# Patient Record
Sex: Male | Born: 1955 | Race: Black or African American | Hispanic: No
Health system: Southern US, Community
[De-identification: ages and names within clinical notes are randomized; demographics above are authoritative.]

---

## 1997-10-24 ENCOUNTER — Ambulatory Visit (HOSPITAL_COMMUNITY): Admission: RE | Admit: 1997-10-24 | Discharge: 1997-10-24 | Payer: Self-pay | Admitting: Urology

## 1998-02-12 ENCOUNTER — Ambulatory Visit (HOSPITAL_BASED_OUTPATIENT_CLINIC_OR_DEPARTMENT_OTHER): Admission: RE | Admit: 1998-02-12 | Discharge: 1998-02-12 | Payer: Self-pay | Admitting: Orthopedic Surgery

## 2002-04-06 ENCOUNTER — Emergency Department (HOSPITAL_COMMUNITY): Admission: EM | Admit: 2002-04-06 | Discharge: 2002-04-06 | Payer: Self-pay | Admitting: Emergency Medicine

## 2002-04-06 ENCOUNTER — Encounter: Payer: Self-pay | Admitting: Emergency Medicine

## 2002-10-30 ENCOUNTER — Encounter: Payer: Self-pay | Admitting: Thoracic Surgery (Cardiothoracic Vascular Surgery)

## 2002-10-30 ENCOUNTER — Encounter: Payer: Self-pay | Admitting: Emergency Medicine

## 2002-10-30 ENCOUNTER — Inpatient Hospital Stay (HOSPITAL_COMMUNITY): Admission: EM | Admit: 2002-10-30 | Discharge: 2002-11-08 | Payer: Self-pay | Admitting: Emergency Medicine

## 2002-11-01 ENCOUNTER — Encounter: Payer: Self-pay | Admitting: Thoracic Surgery (Cardiothoracic Vascular Surgery)

## 2002-11-02 ENCOUNTER — Encounter: Payer: Self-pay | Admitting: Thoracic Surgery (Cardiothoracic Vascular Surgery)

## 2002-11-03 ENCOUNTER — Encounter: Payer: Self-pay | Admitting: Thoracic Surgery (Cardiothoracic Vascular Surgery)

## 2002-11-04 ENCOUNTER — Encounter: Payer: Self-pay | Admitting: Thoracic Surgery (Cardiothoracic Vascular Surgery)

## 2002-11-12 ENCOUNTER — Encounter
Admission: RE | Admit: 2002-11-12 | Discharge: 2002-11-12 | Payer: Self-pay | Admitting: Thoracic Surgery (Cardiothoracic Vascular Surgery)

## 2002-11-12 ENCOUNTER — Inpatient Hospital Stay (HOSPITAL_COMMUNITY)
Admission: AD | Admit: 2002-11-12 | Discharge: 2002-11-17 | Payer: Self-pay | Admitting: Thoracic Surgery (Cardiothoracic Vascular Surgery)

## 2002-11-12 ENCOUNTER — Encounter: Payer: Self-pay | Admitting: Thoracic Surgery (Cardiothoracic Vascular Surgery)

## 2002-11-13 ENCOUNTER — Encounter: Payer: Self-pay | Admitting: Thoracic Surgery (Cardiothoracic Vascular Surgery)

## 2002-11-26 ENCOUNTER — Encounter
Admission: RE | Admit: 2002-11-26 | Discharge: 2002-11-26 | Payer: Self-pay | Admitting: Thoracic Surgery (Cardiothoracic Vascular Surgery)

## 2002-11-26 ENCOUNTER — Encounter: Payer: Self-pay | Admitting: Thoracic Surgery (Cardiothoracic Vascular Surgery)

## 2002-12-03 ENCOUNTER — Encounter (HOSPITAL_COMMUNITY)
Admission: RE | Admit: 2002-12-03 | Discharge: 2003-03-03 | Payer: Self-pay | Admitting: Thoracic Surgery (Cardiothoracic Vascular Surgery)

## 2006-03-16 IMAGING — CR DG CHEST 1V PORT
1 series · 1 of 1 positions shown · non-contrast
Comparison: 11/13/2002

CLINICAL DATA: Chest pain. 
 PORTABLE CHEST - 03/16/2006:

[view not recorded]
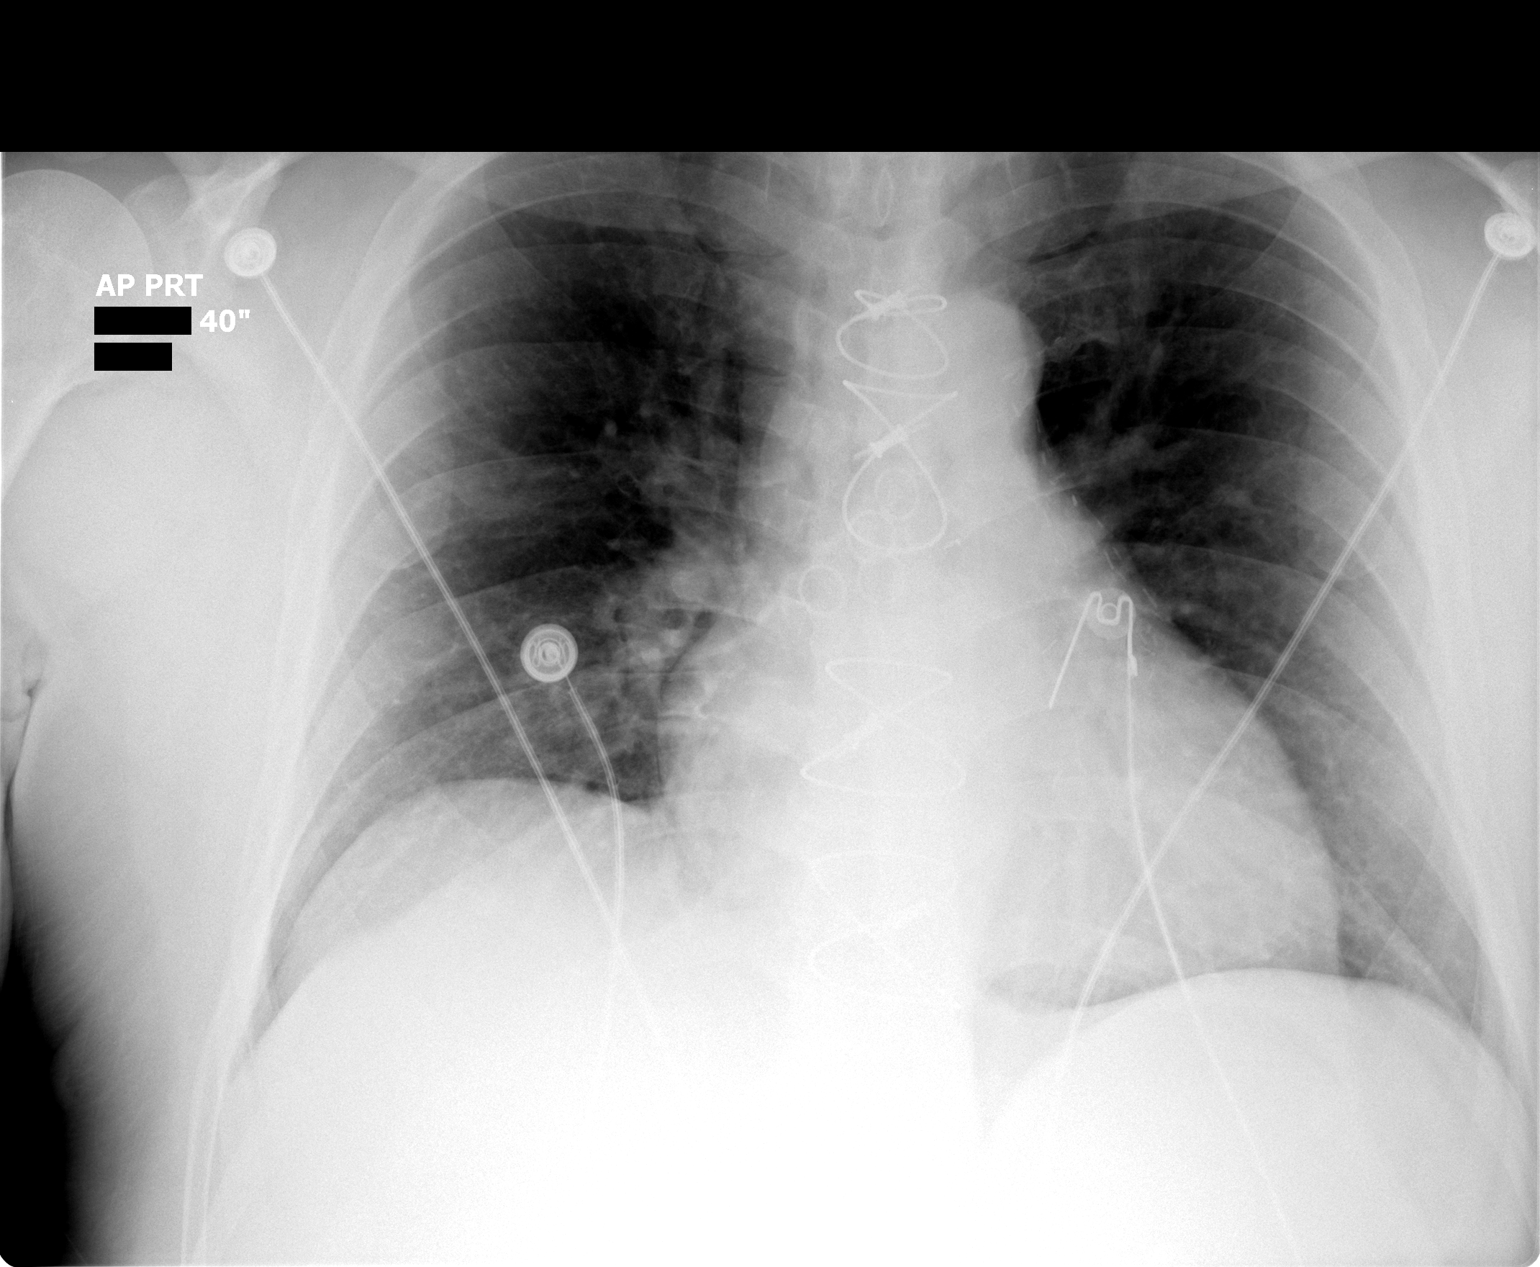

[1 of 1 positions shown; findings below may reference images not displayed]

FINDINGS: Cardiomegaly and evidence of cardiac surgery again noted.  Pulmonary vascular congestion is noted without evidence of pulmonary edema.  Elevated right hemidiaphragm is unchanged.  No pleural effusions or pneumothorax identified.
IMPRESSION: Cardiomegaly with pulmonary vascular congestion.

## 2006-03-17 ENCOUNTER — Inpatient Hospital Stay (HOSPITAL_COMMUNITY): Admission: EM | Admit: 2006-03-17 | Discharge: 2006-03-17 | Payer: Self-pay | Admitting: Emergency Medicine

## 2006-05-02 ENCOUNTER — Emergency Department (HOSPITAL_COMMUNITY): Admission: EM | Admit: 2006-05-02 | Discharge: 2006-05-02 | Payer: Self-pay | Admitting: Emergency Medicine

## 2006-05-02 IMAGING — CT CT ANGIO CHEST
1 of 2 series · 19 of 30 positions shown · IV contrast (APPLIED)
Comparison: No comparison CT.  Comparison is from a chest examination dated 05/02/06.

CLINICAL DATA: 50-year-old, with chest tightness and elevated D-dimer.  Evaluate for pulmonary embolism.  
CT ANGIOGRAPHY OF CHEST:
TECHNIQUE: Multidetector CT imaging of the chest was performed during bolus injection of intravenous contrast.  Multiplanar CT angiographic image reconstructions were generated to evaluate the vascular anatomy.
Contrast:  80 cc

[Series 7: pe 1.0 b20f st · axial · 0.70mm/px · z∈[-286,-32]mm · 19 of 284 slices shown]
[im 15/284  lung]
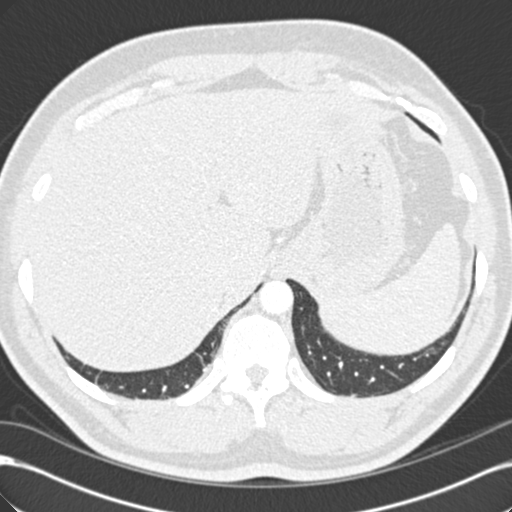
[im 29/284  mediastinal]
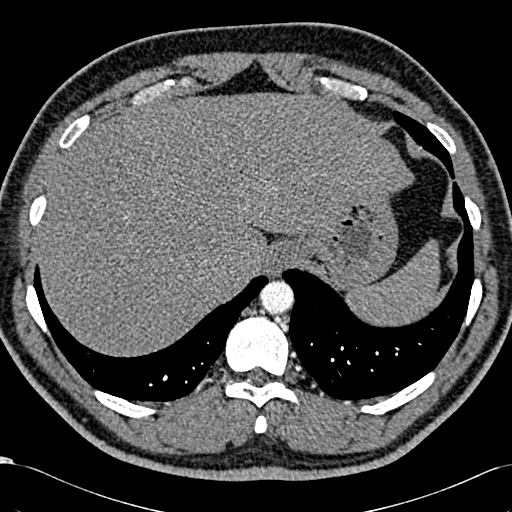
[im 43/284  lung]
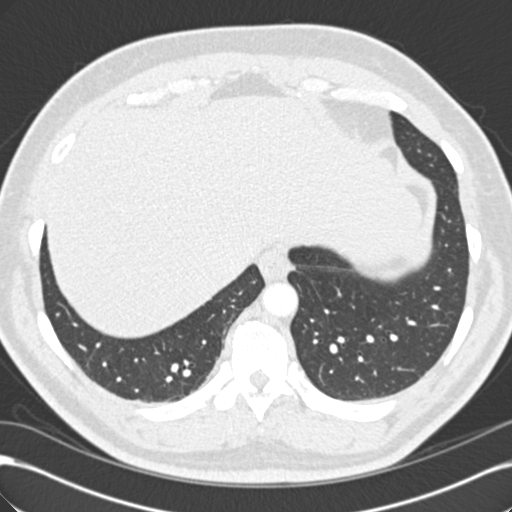
[im 57/284  mediastinal]
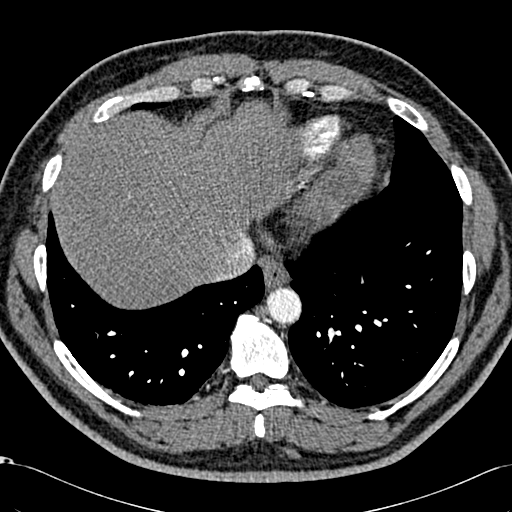
[im 71/284  lung]
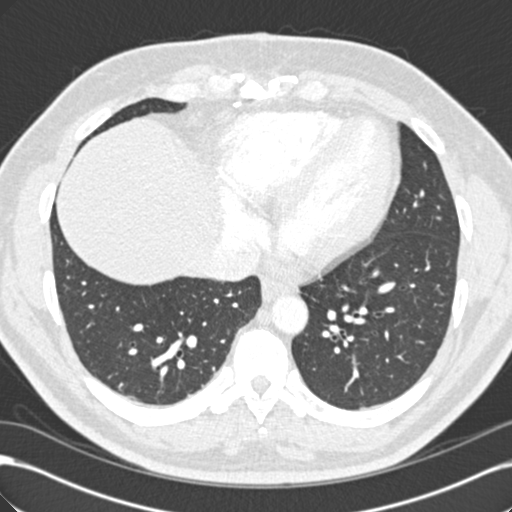
[im 85/284  mediastinal]
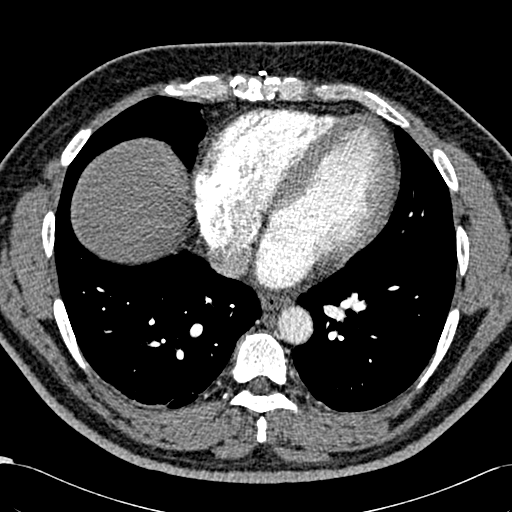
[im 100/284  lung]
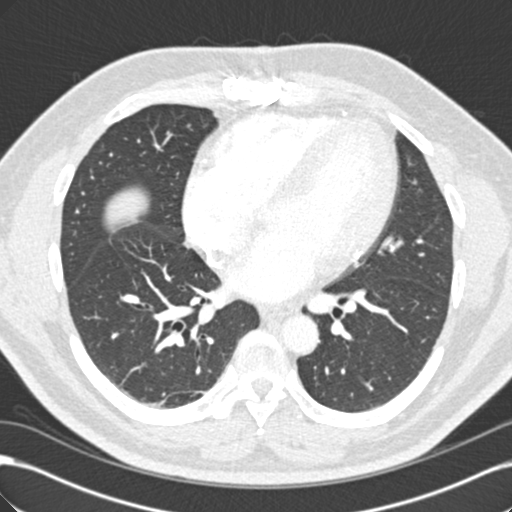
[im 114/284  mediastinal]
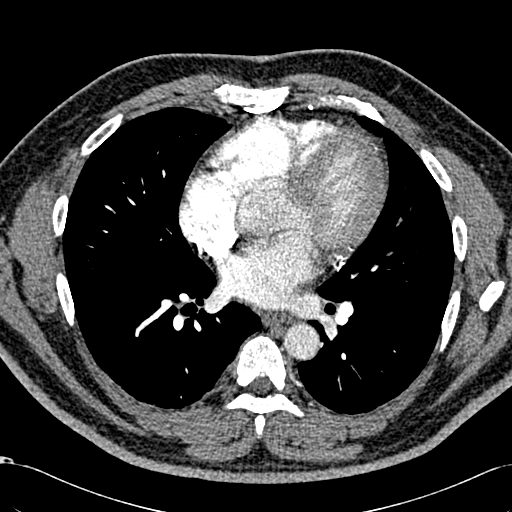
[im 128/284  lung]
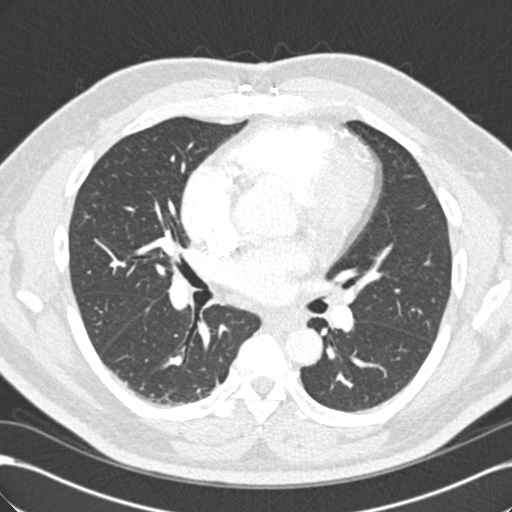
[im 142/284  mediastinal]
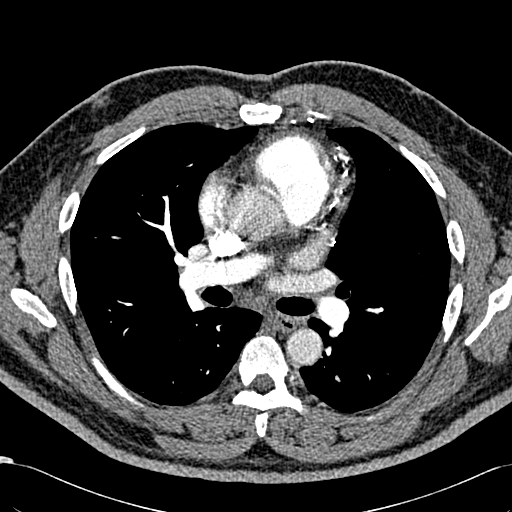
[im 156/284  lung]
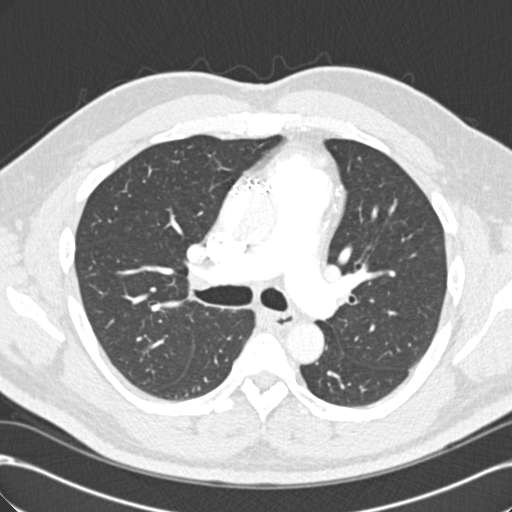
[im 170/284  mediastinal]
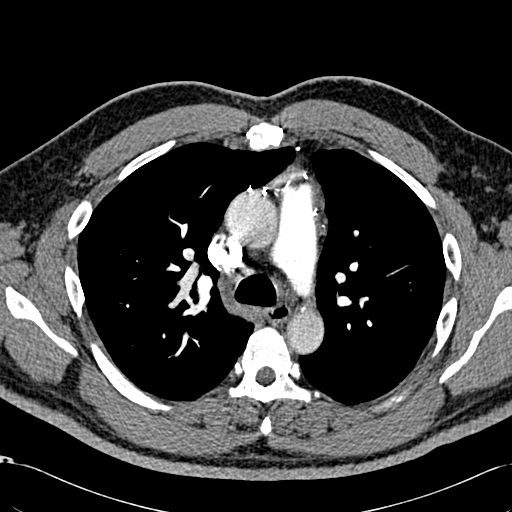
[im 184/284  lung]
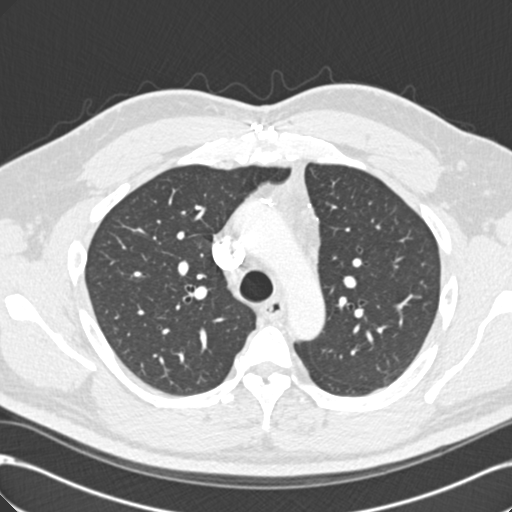
[im 199/284  mediastinal]
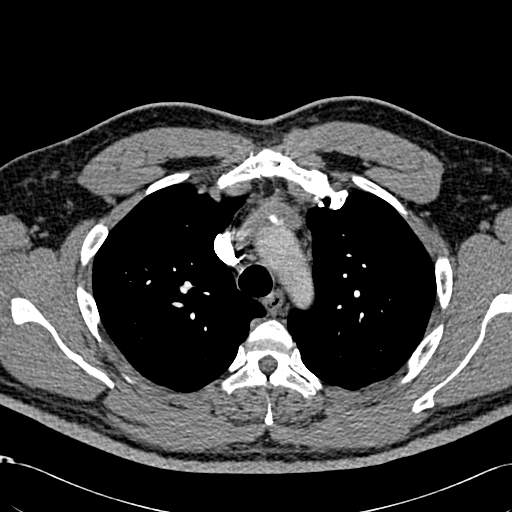
[im 213/284  lung]
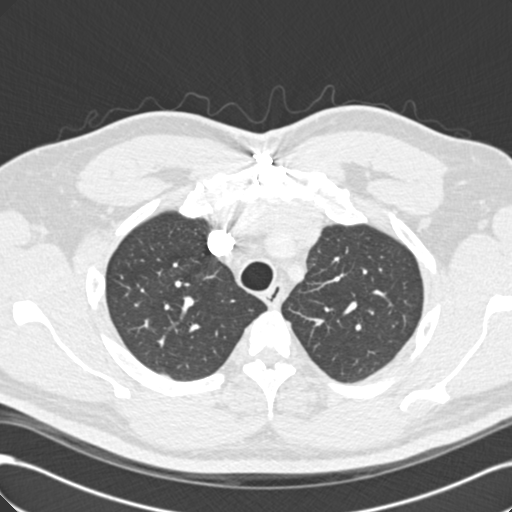
[im 227/284  mediastinal]
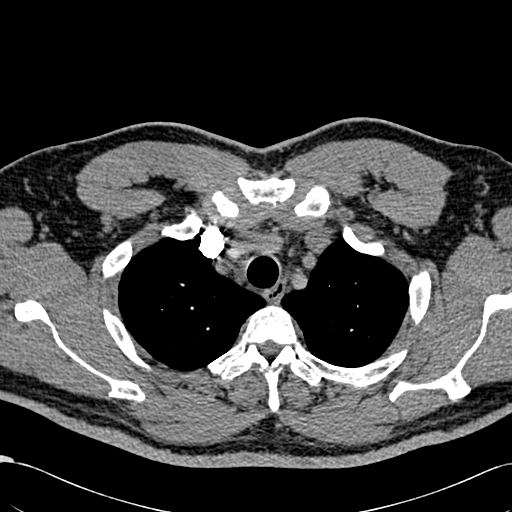
[im 241/284  lung]
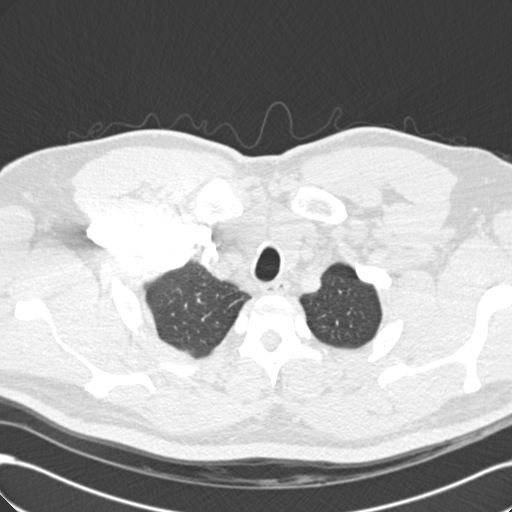
[im 255/284  mediastinal]
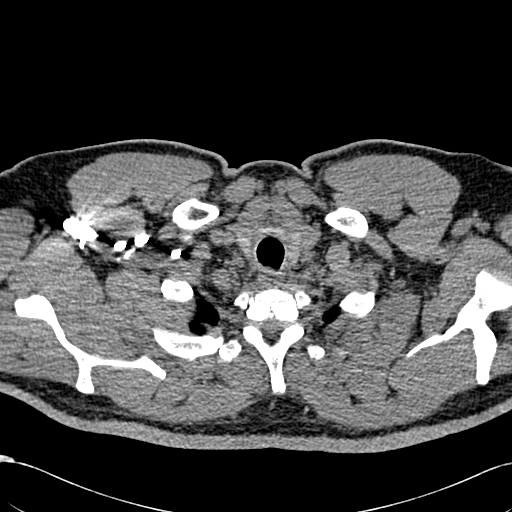
[im 269/284  lung]
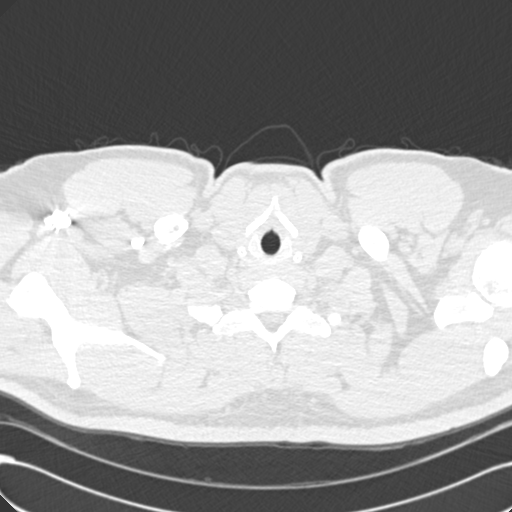

[19 of 30 positions shown; findings below may reference images not displayed]

FINDINGS: Patient has undergone a median sternotomy.  There is stranding in the anterior mediastinal tissues likely postoperative in nature.  The main pulmonary arteries are patent.  No evidence for pulmonary embolism.  Small mediastinal lymph nodes .  The largest nodes are in the subcarinal station measuring 1.2 cm in the short axis on image 48.  Prominent right paratracheal node in the superior mediastinum measures 1 cm on image #16.  Limited evaluation of the upper abdominal structures.  The trachea and mainstem bronchi are patent.  The lungs are clear bilaterally.  No acute bone abnormalities.
IMPRESSION: 1.  Negative CTA of the chest.  Negative for pulmonary embolism. 
2.  Mild lymphadenopathy in the chest and upper mediastinum.  Etiology is indeterminate.

## 2006-05-02 IMAGING — CR DG ABDOMEN ACUTE W/ 1V CHEST
4 series · 4 of 4 positions shown · non-contrast
Comparison: Chest x-ray of 03/16/06.

CLINICAL DATA: Upper abdominal pain. 
 ACUTE ABDOMINAL SERIES WITH CHEST ? 3 VIEW:

[w chest pa]
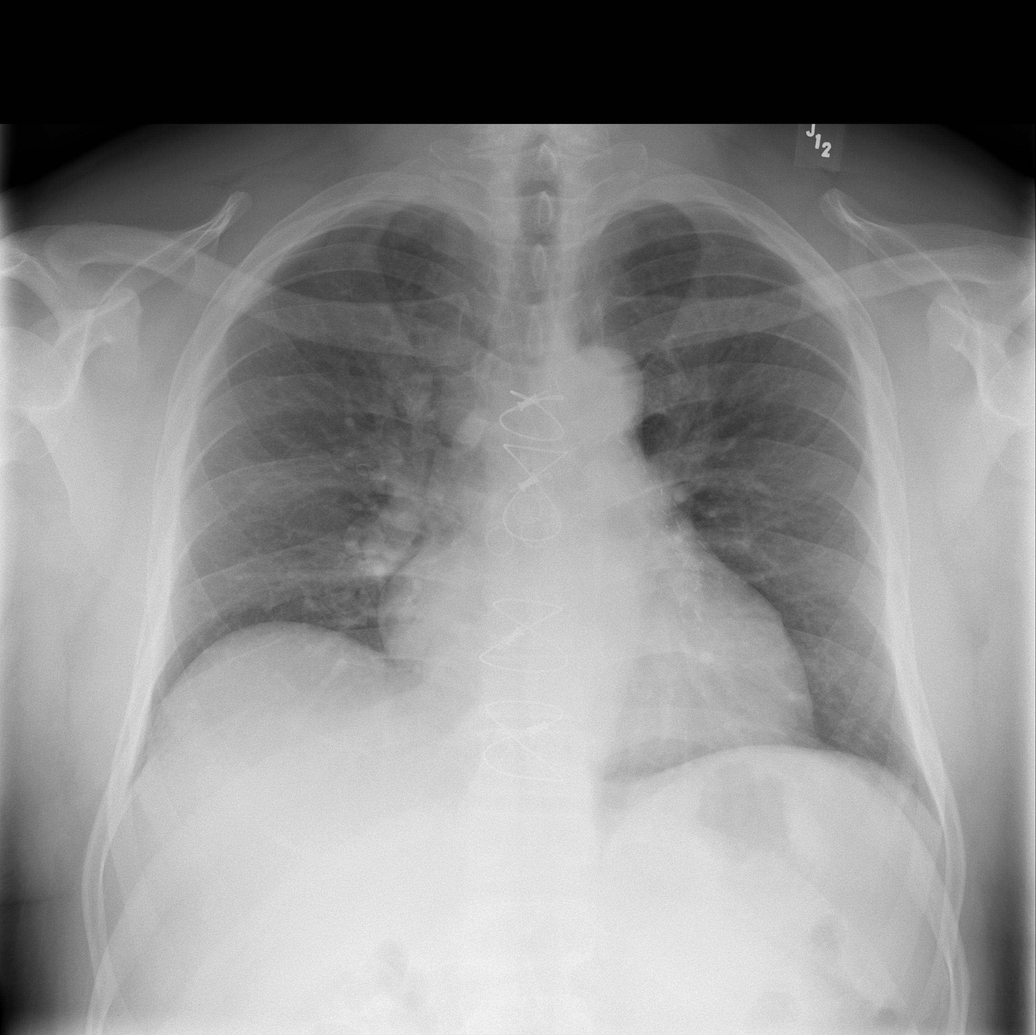

[w abdomen upright]
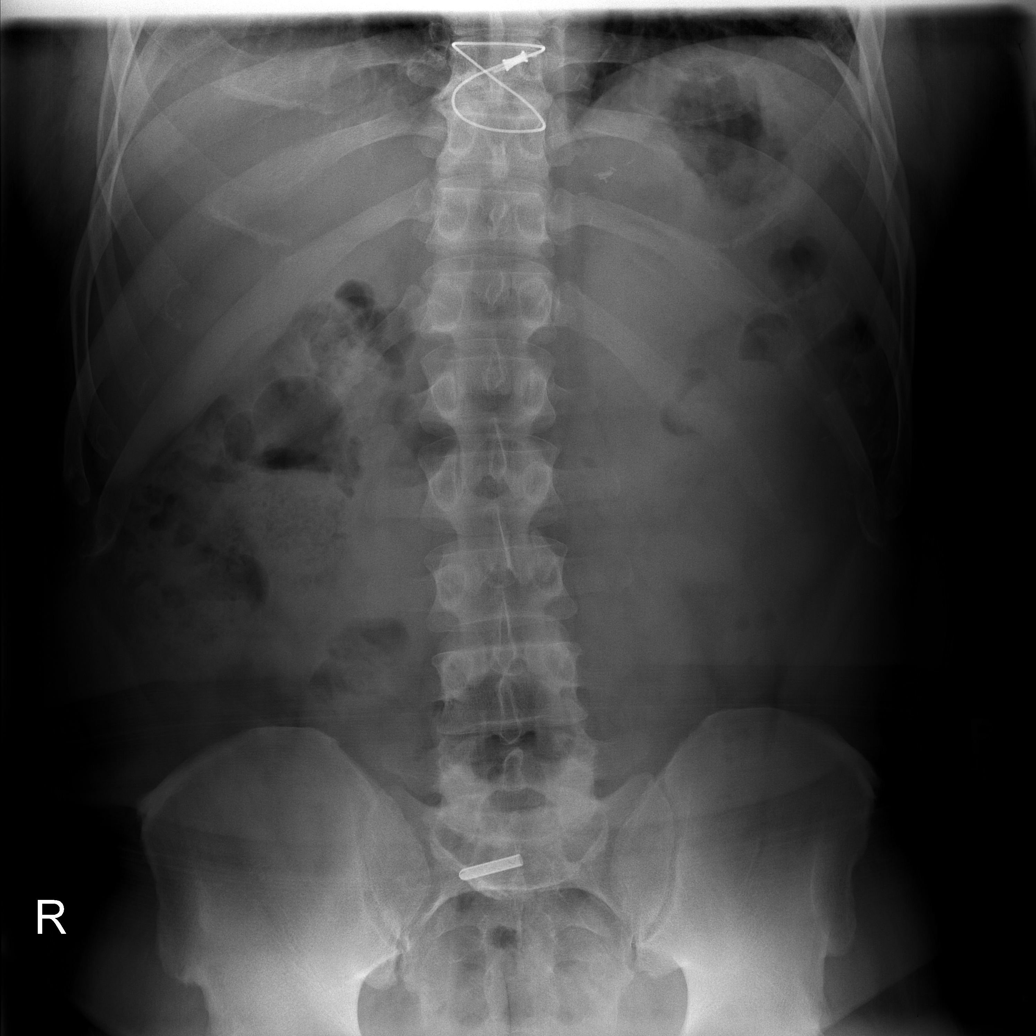

[t abdomen supine (1 of 2)]
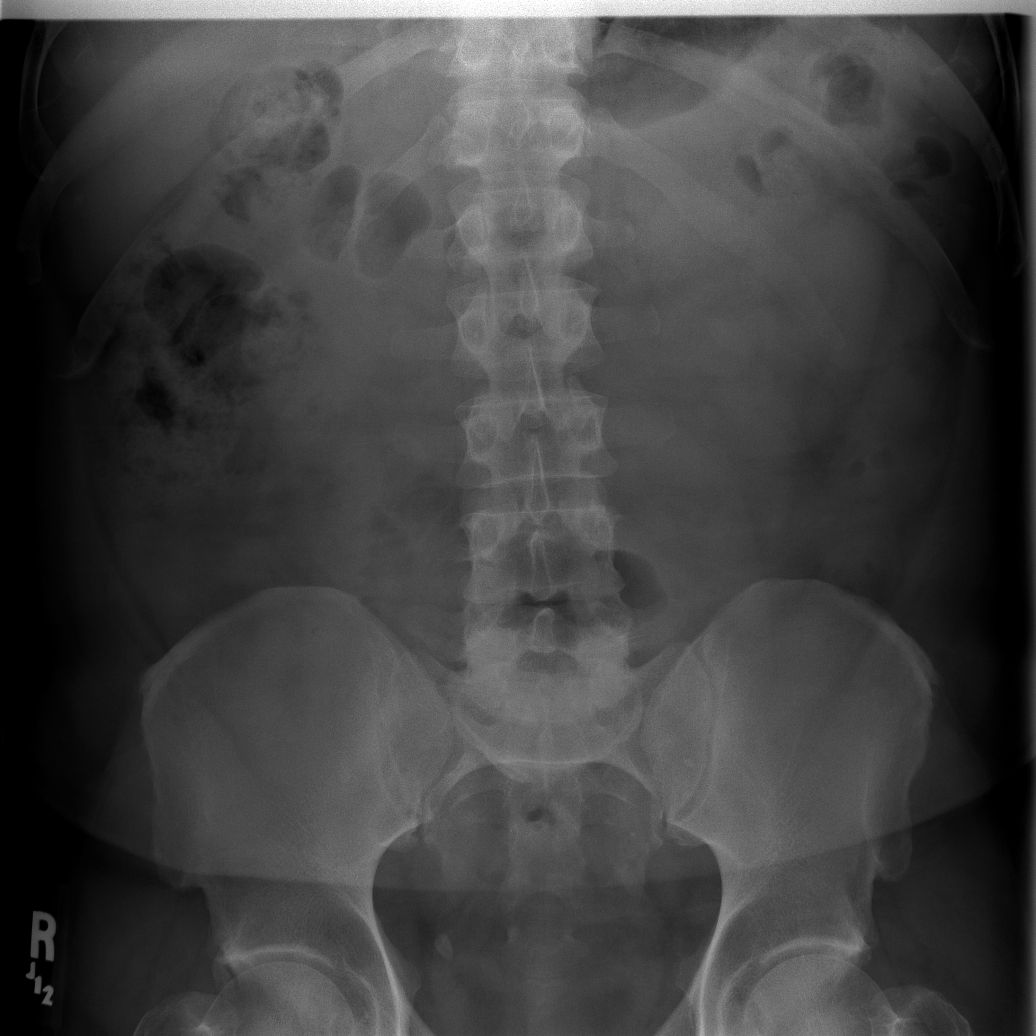

[t abdomen supine (2 of 2)]
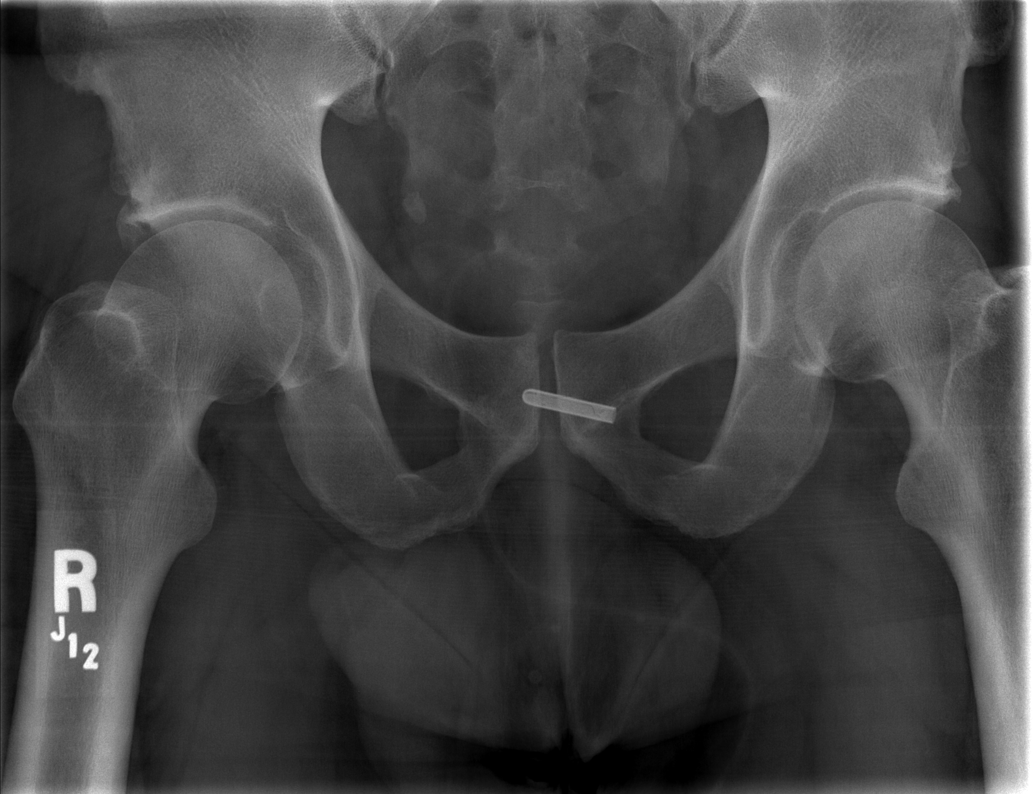

[4 of 4 positions shown; findings below may reference images not displayed]

FINDINGS: Frontal view of the chest shows stable heart size.  Lungs are low in volume but clear.  Two views of the abdomen show unremarkable bowel gas pattern.  A radiopaque cylindrical object projecting over the anatomic pelvis is presumably external to the patient.
IMPRESSION: No acute findings.

## 2006-05-04 ENCOUNTER — Ambulatory Visit (HOSPITAL_COMMUNITY): Admission: RE | Admit: 2006-05-04 | Discharge: 2006-05-04 | Payer: Self-pay | Admitting: Emergency Medicine

## 2006-05-04 IMAGING — US US ABDOMEN COMPLETE
1 series · 14 of 25 positions shown · non-contrast
Comparison: none

CLINICAL DATA: Abdominal and epigastric pain. CABG. Hypertension. Diabetes. 
COMPLETE ABDOMEN ULTRASOUND:
TECHNIQUE: Complete abdominal ultrasound examination was performed including evaluation of the liver, gallbladder, bile ducts, pancreas, kidneys, spleen, IVC, and abdominal aorta.

[Series 1: unknown · 0.30mm/px · 14 of 98 slices shown]
[im 1/98]
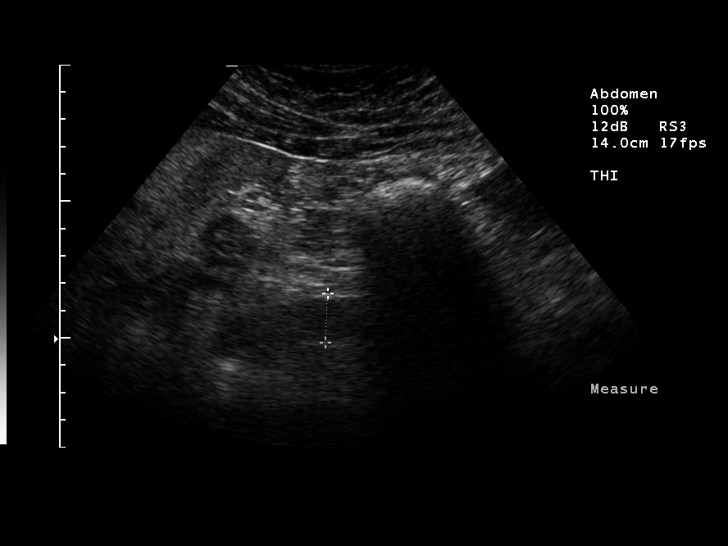
[im 9/98]
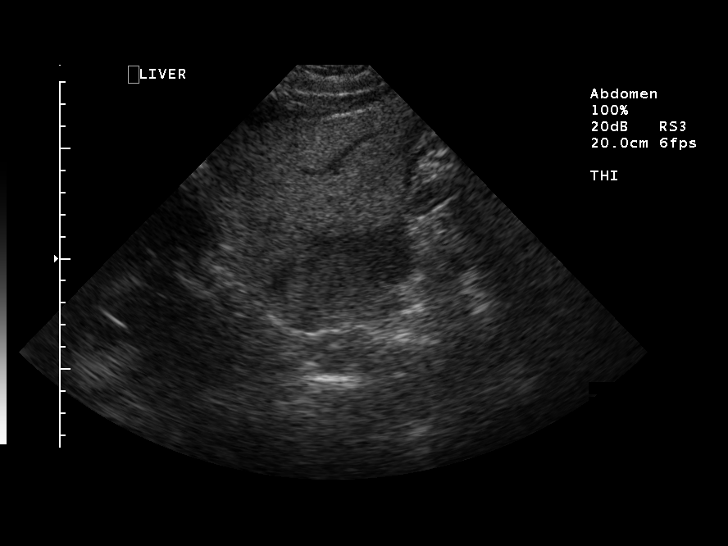
[im 17/98]
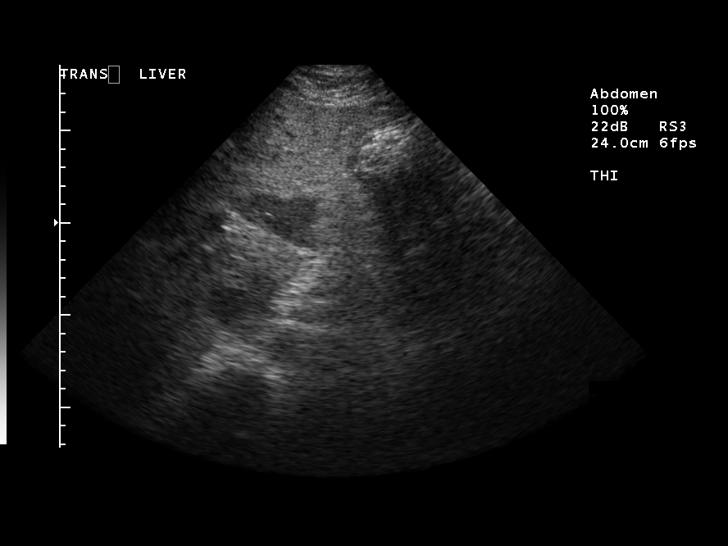
[im 25/98]
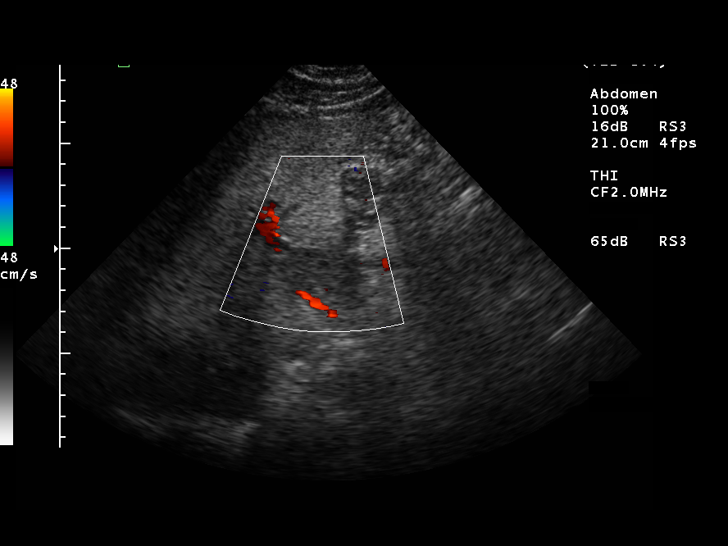
[im 33/98]
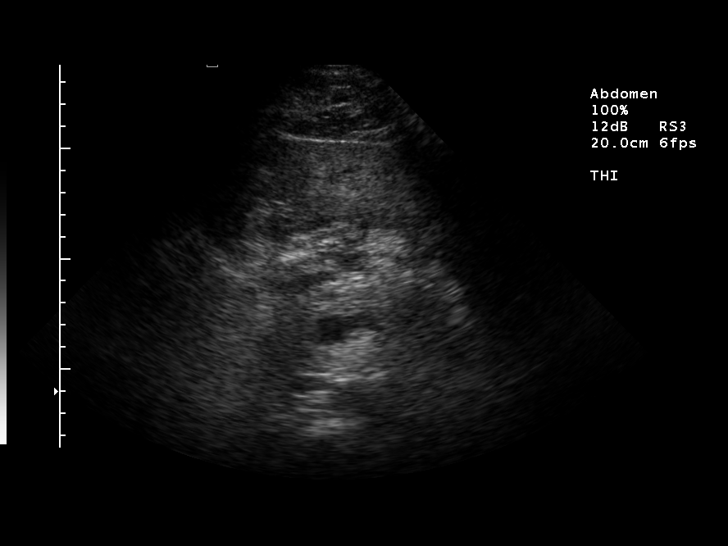
[im 37/98]
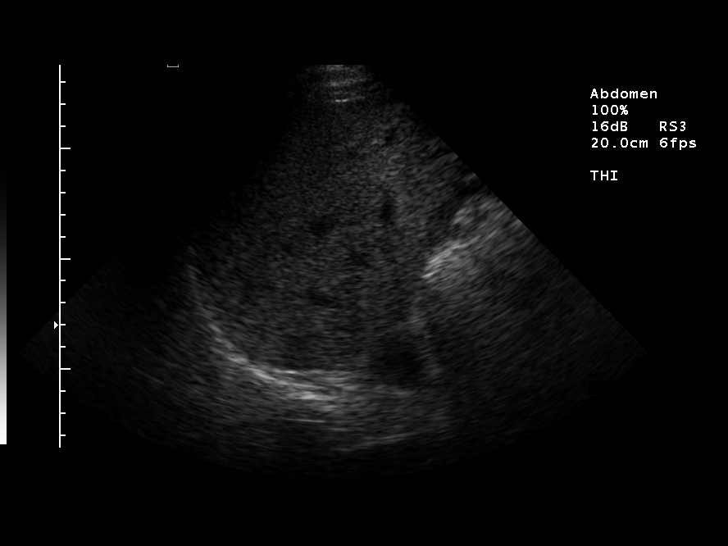
[im 45/98]
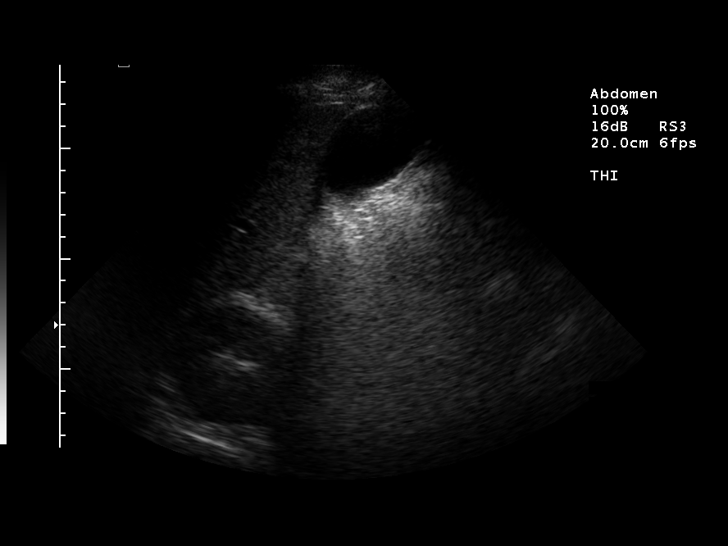
[im 53/98]
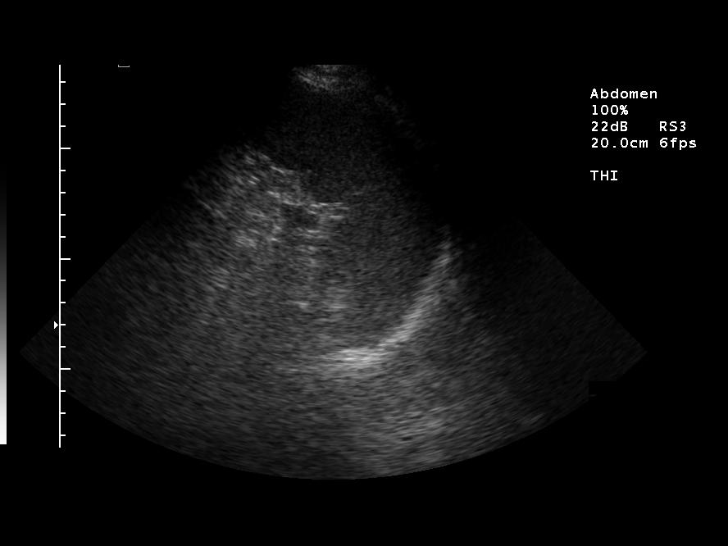
[im 61/98]
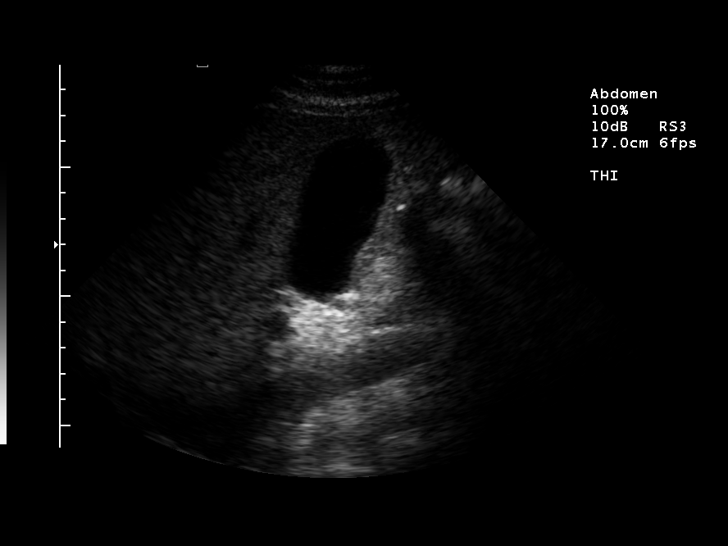
[im 65/98]
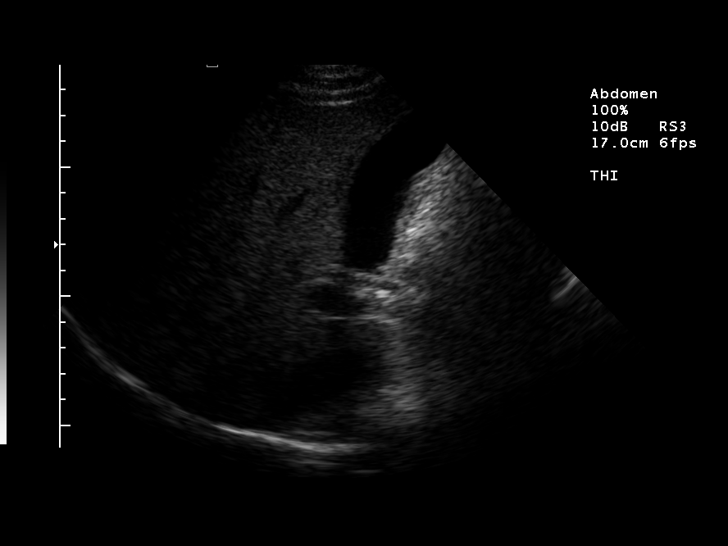
[im 73/98]
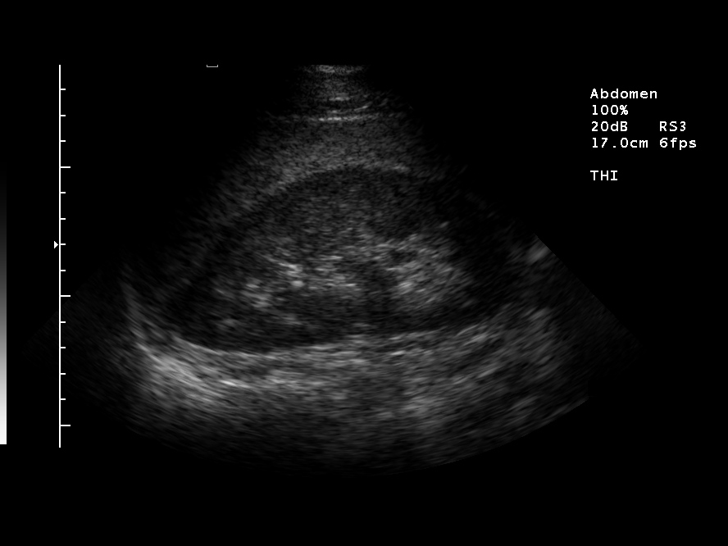
[im 81/98]
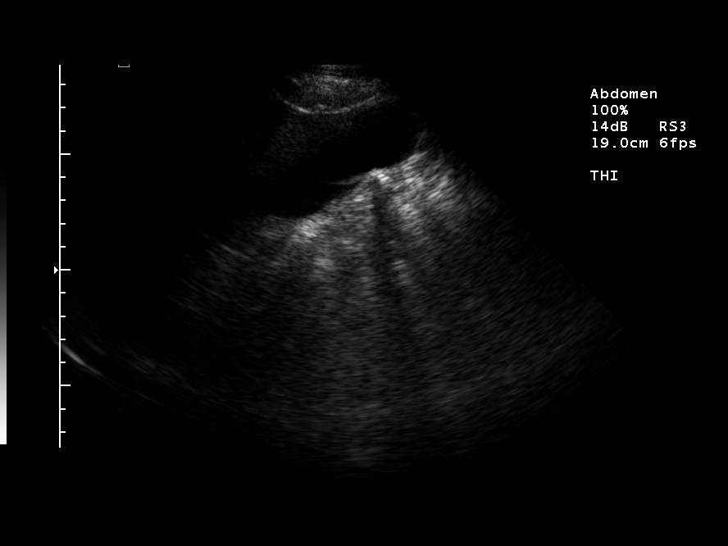
[im 89/98]
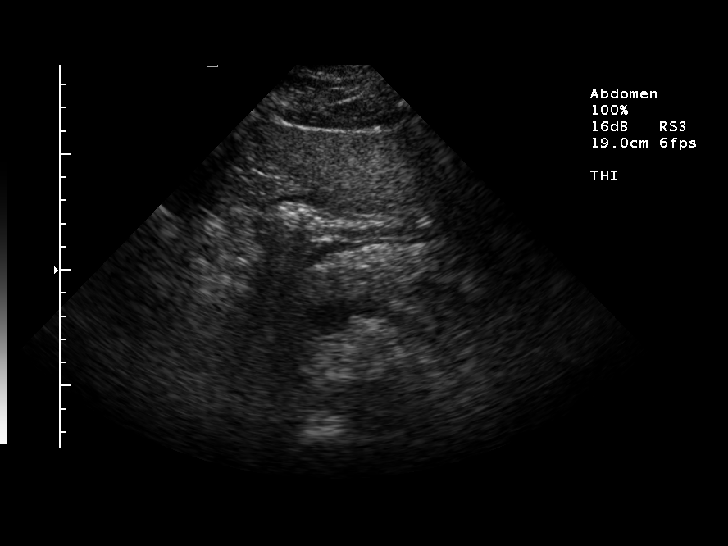
[im 98/98]
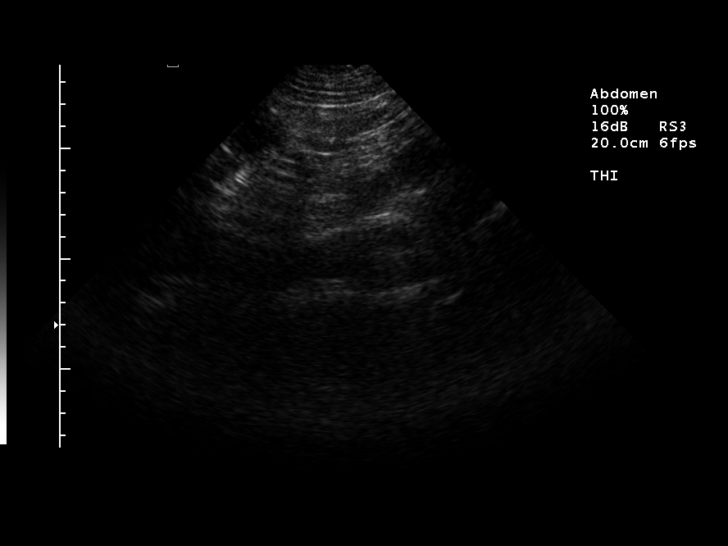

[14 of 25 positions shown; findings below may reference images not displayed]

FINDINGS: Normal gallbladder. No gallstones or wall thickening. Wall thickness is 2.2 mm. The common duct measures 4.1 mm, which is well within normal limits.  The findings are compatible with diffuse fatty infiltration of the liver.  There is a 2.9 x 4.7 x 5.1 cm focus of relatively normal hepatic echotexture/echogenicity in the inferior aspect of the right lobe, which is compatible with an area of focal fatty sparing. Patient IVC. Maximum diameter of the abdominal aorta is 2.7 cm.  Unremarkable pancreas, spleen, and kidneys. The spleen measures 10.4 cm in length.  The right and left kidneys measure 13.6 cm and 14.2 cm in length, respectively.
IMPRESSION: Fatty infiltration of the liver.  Incidental focus of fatty sparing in the right hepatic lobe. No biliary ductal dilatation. Normal gallbladder.

## 2007-04-26 ENCOUNTER — Emergency Department (HOSPITAL_COMMUNITY): Admission: EM | Admit: 2007-04-26 | Discharge: 2007-04-26 | Payer: Self-pay | Admitting: Emergency Medicine

## 2007-04-26 IMAGING — CR DG CHEST 2V
2 series · 2 of 2 positions shown · non-contrast
Comparison: 03/16/06 and 05/02/06.

CLINICAL DATA: 51-year-old male with dizziness, acute chest pain, history of coronary bypass, diabetes.  
 CHEST - 2 VIEW:

[w chest pa]
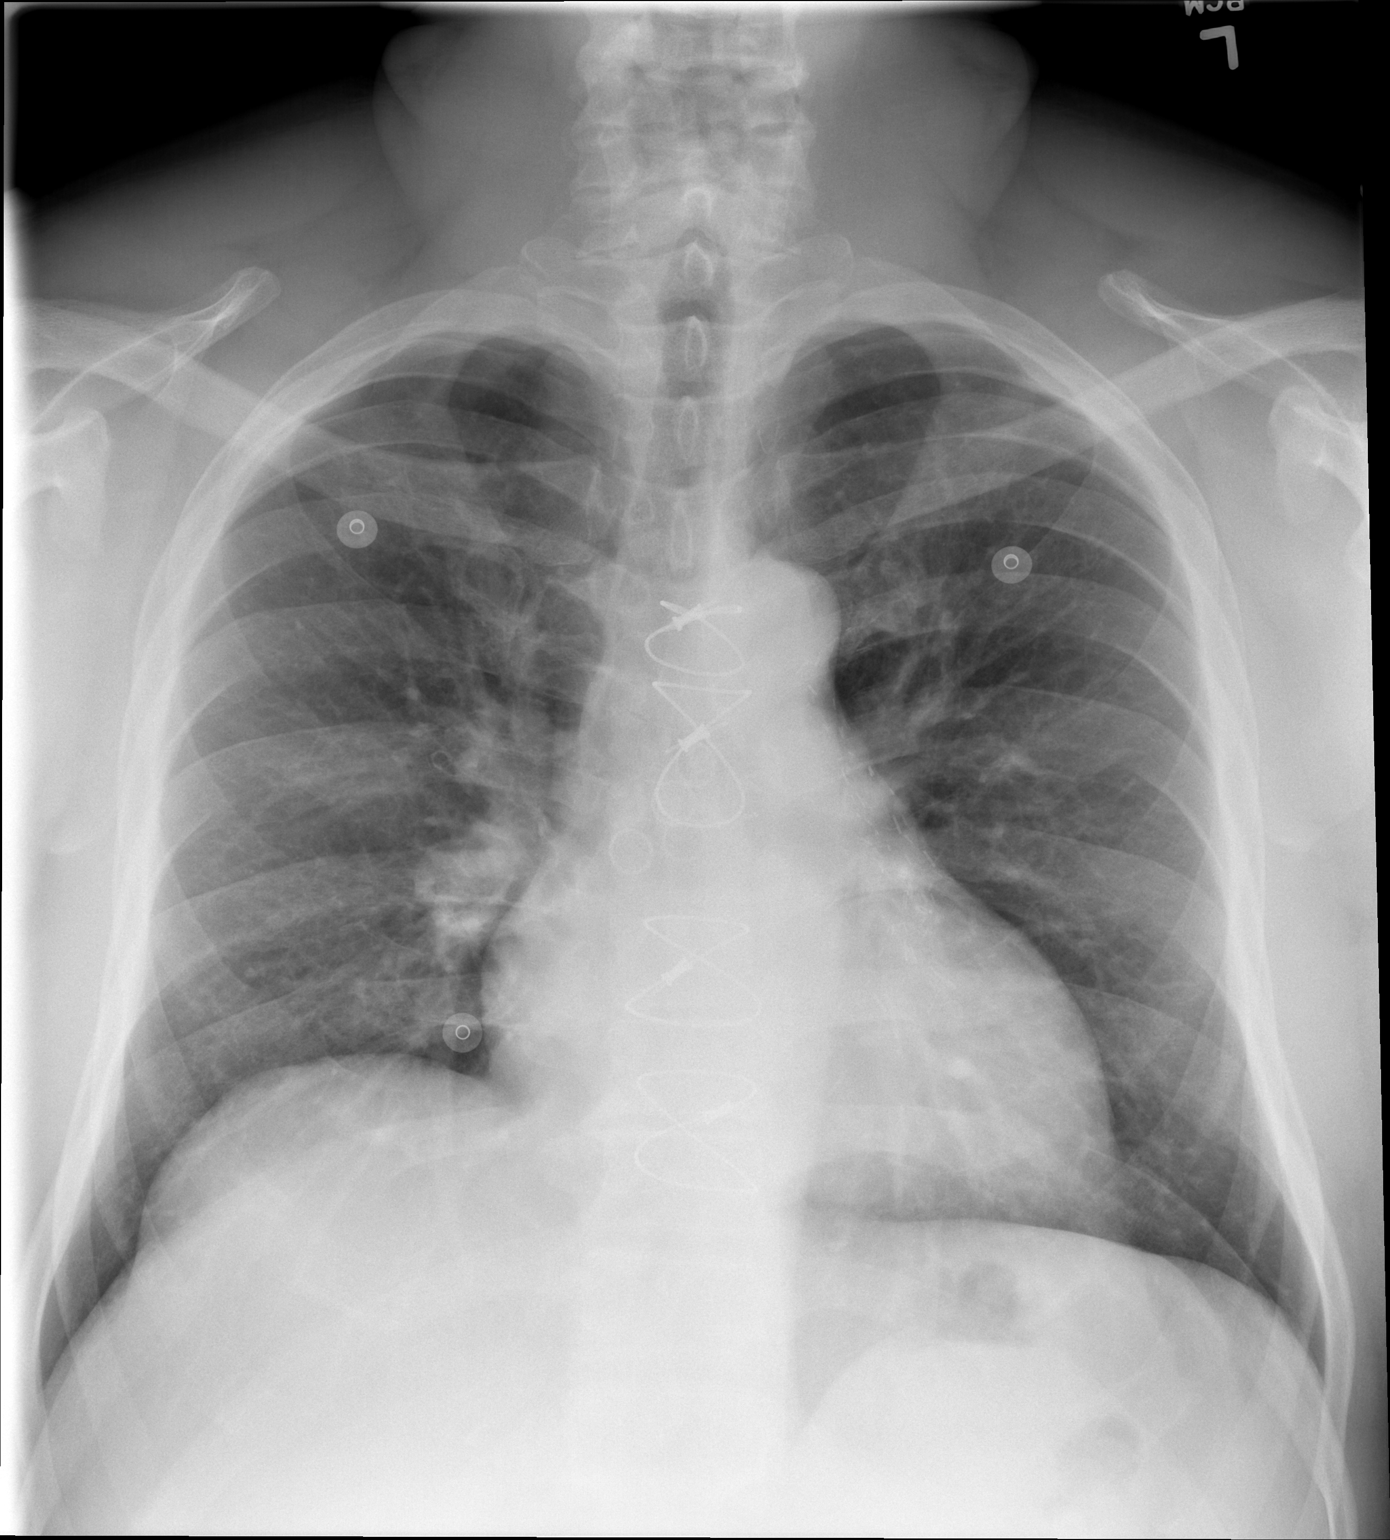

[w chest lat]
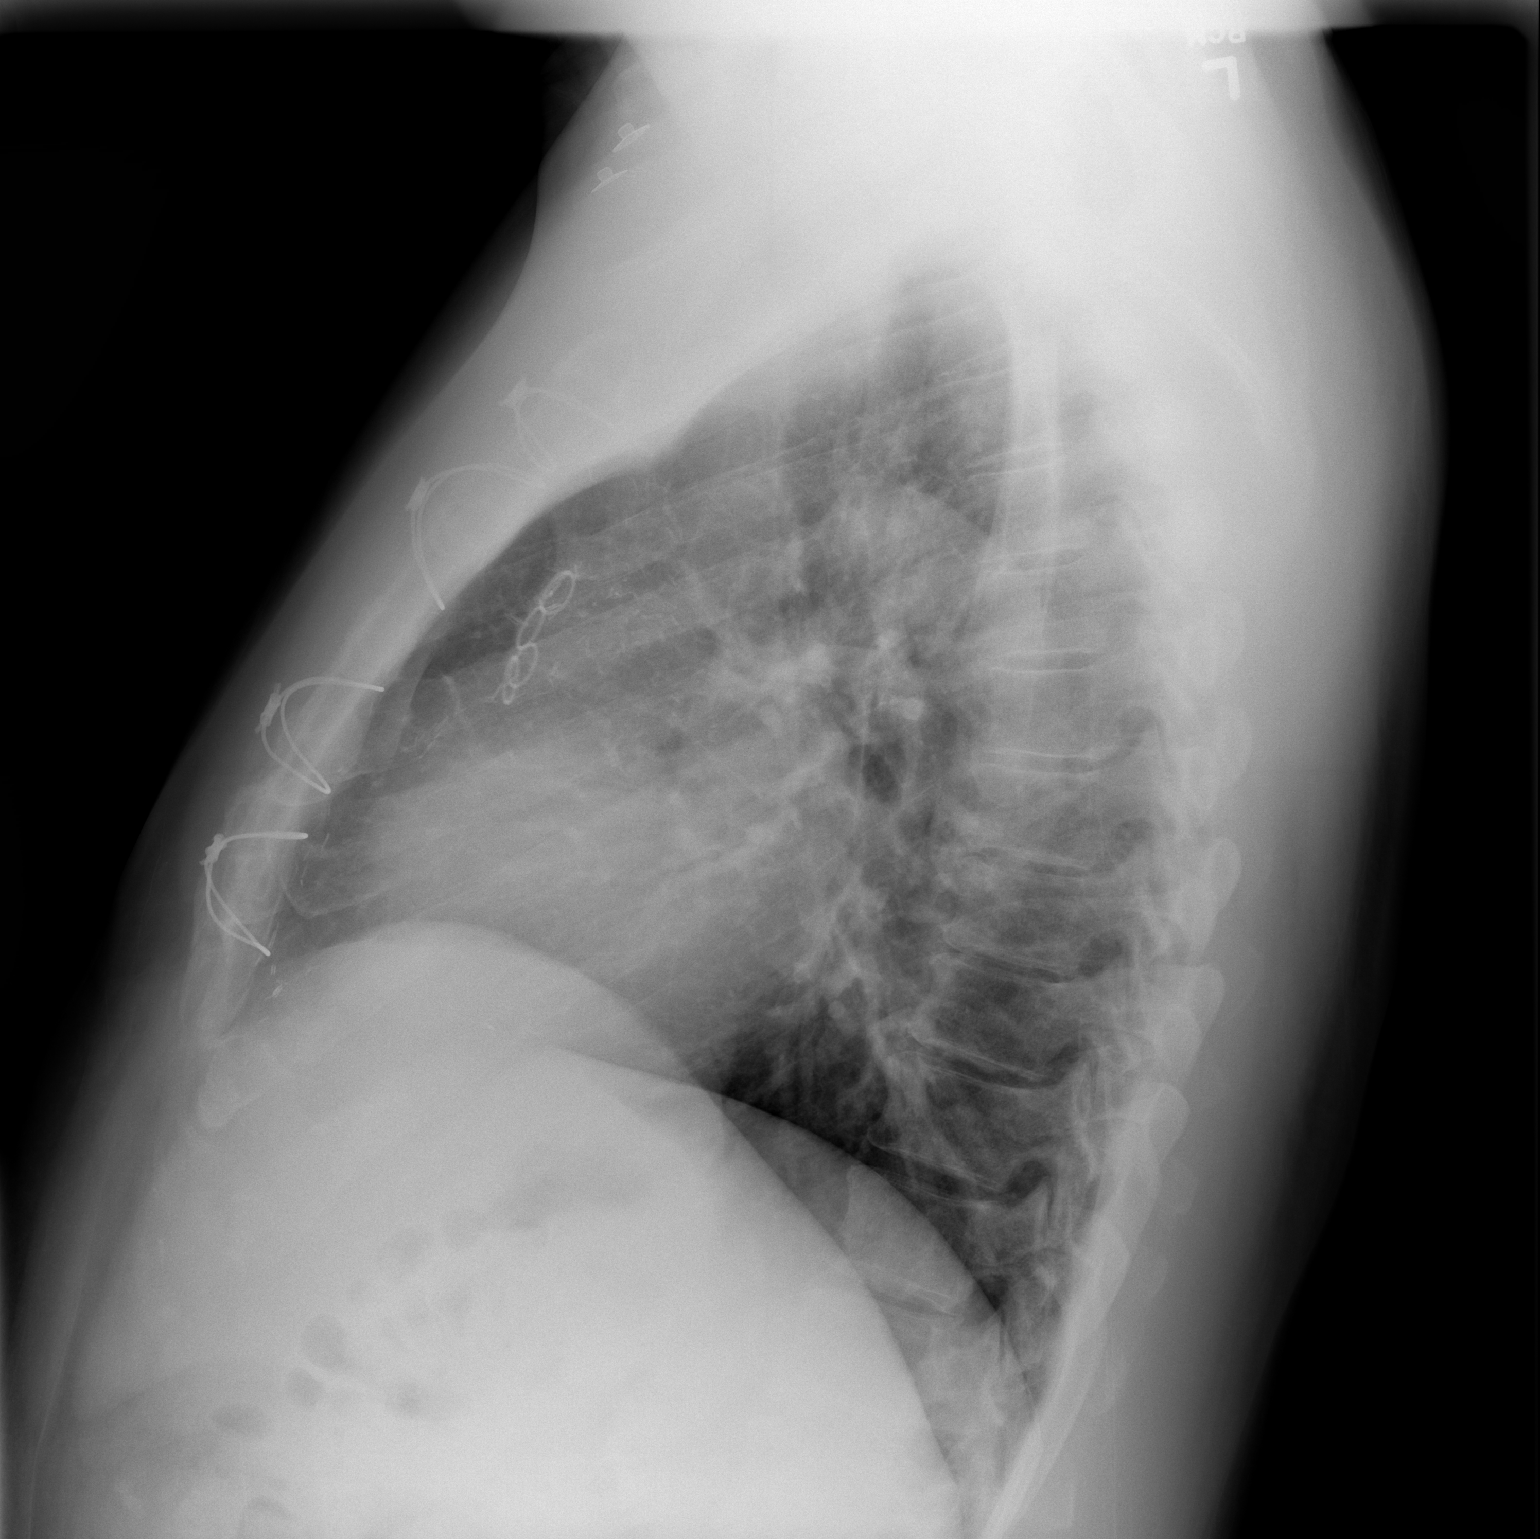

[2 of 2 positions shown; findings below may reference images not displayed]

FINDINGS: The patient is status post median sternotomy for coronary bypass.  Mild cardiomegaly.  No acute CHF, pneumonia, effusion, or pneumothorax.  Midline trachea.
IMPRESSION: Postoperative changes.  No acute finding.  Stable exam.

## 2007-06-11 ENCOUNTER — Emergency Department (HOSPITAL_COMMUNITY): Admission: EM | Admit: 2007-06-11 | Discharge: 2007-06-11 | Payer: Self-pay | Admitting: Emergency Medicine

## 2007-06-14 ENCOUNTER — Emergency Department (HOSPITAL_COMMUNITY): Admission: EM | Admit: 2007-06-14 | Discharge: 2007-06-14 | Payer: Self-pay | Admitting: Emergency Medicine

## 2008-01-05 ENCOUNTER — Ambulatory Visit (HOSPITAL_BASED_OUTPATIENT_CLINIC_OR_DEPARTMENT_OTHER): Admission: RE | Admit: 2008-01-05 | Discharge: 2008-01-05 | Payer: Self-pay | Admitting: Orthopedic Surgery

## 2009-06-26 ENCOUNTER — Inpatient Hospital Stay (HOSPITAL_COMMUNITY): Admission: EM | Admit: 2009-06-26 | Discharge: 2009-06-27 | Payer: Self-pay | Admitting: Emergency Medicine

## 2009-06-26 IMAGING — CR DG CHEST 1V PORT
1 series · 1 of 1 positions shown · non-contrast
Comparison: 04/26/2007

CLINICAL DATA: Chest pain, dizziness.

PORTABLE CHEST - 1 VIEW

[AP]
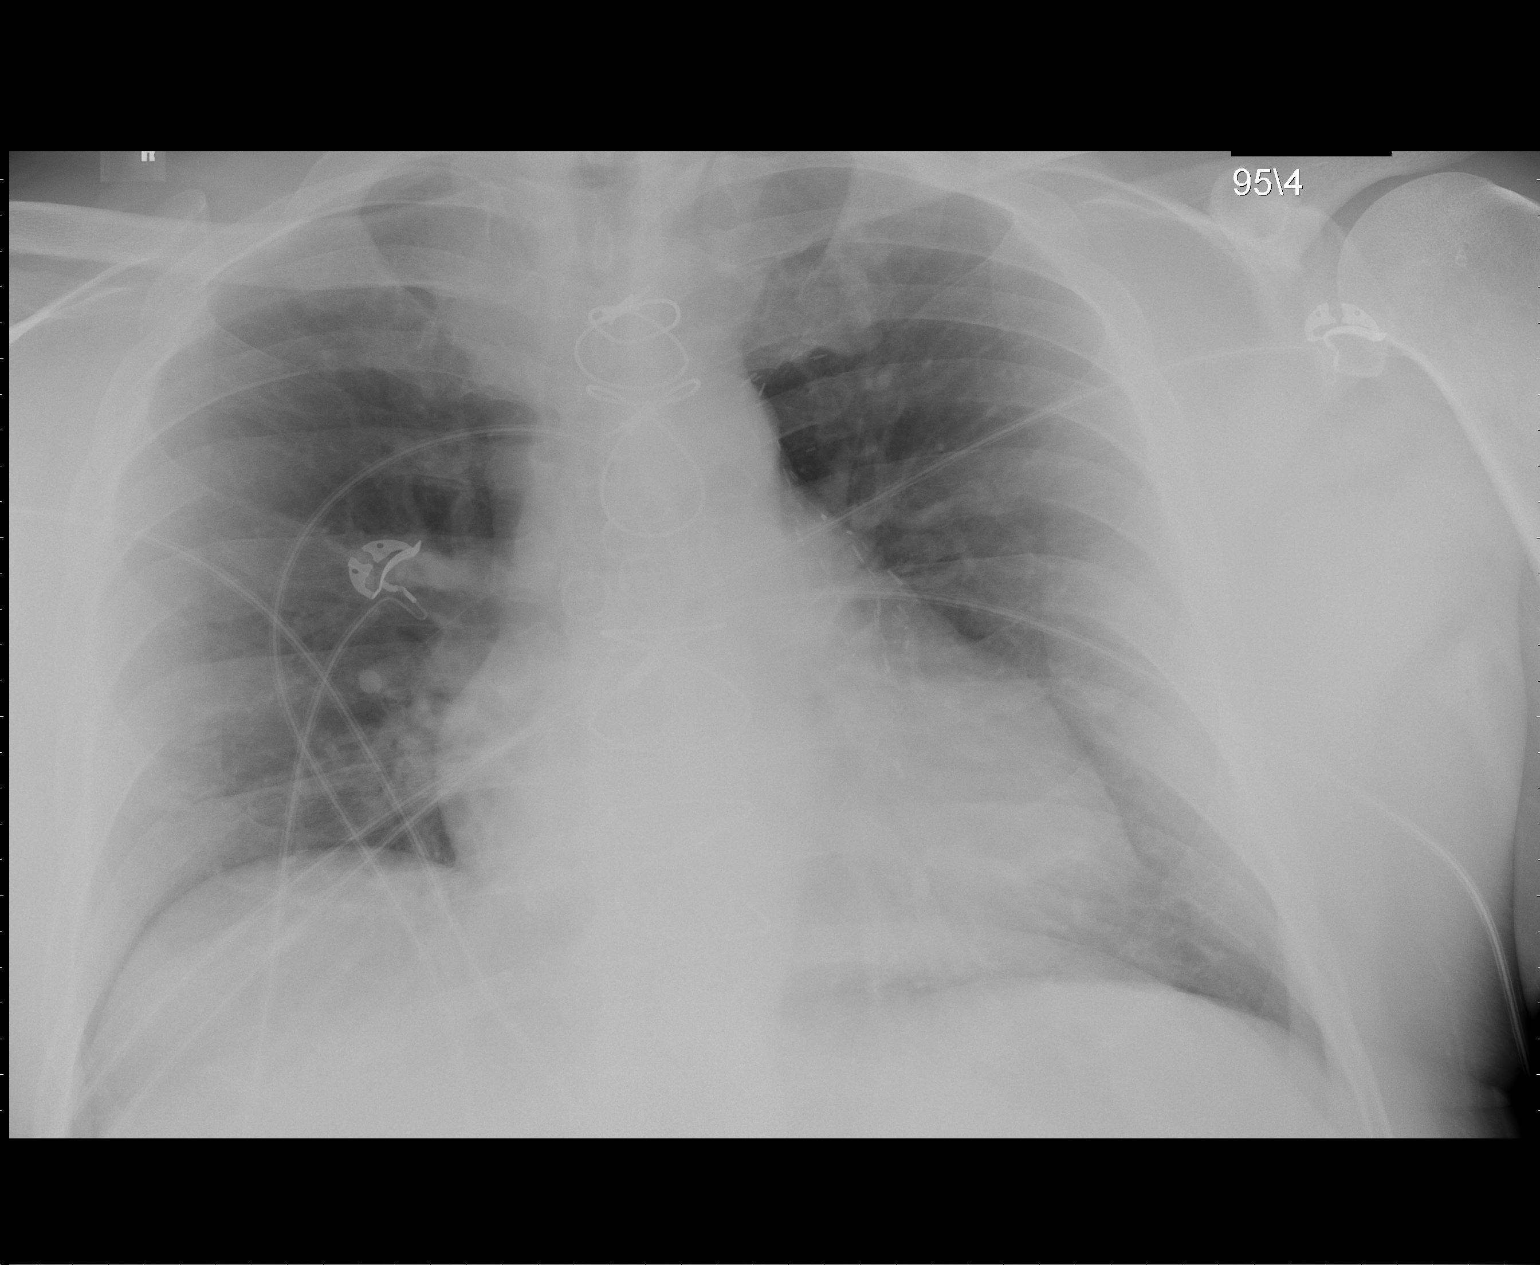

[1 of 1 positions shown; findings below may reference images not displayed]

FINDINGS: Prior CABG.  There is cardiomegaly.  Lungs are clear.
There is mild vascular congestion.  No focal airspace opacities,
effusions or edema.
IMPRESSION: Cardiomegaly, vascular congestion.

## 2009-07-11 ENCOUNTER — Encounter (HOSPITAL_COMMUNITY): Admission: RE | Admit: 2009-07-11 | Discharge: 2009-09-17 | Payer: Self-pay | Admitting: Cardiology

## 2010-05-10 ENCOUNTER — Encounter: Payer: Self-pay | Admitting: Emergency Medicine

## 2010-07-12 LAB — CBC
HCT: 42.6 % (ref 39.0–52.0)
Hemoglobin: 13.7 g/dL (ref 13.0–17.0)
Hemoglobin: 14.3 g/dL (ref 13.0–17.0)
MCHC: 33.4 g/dL (ref 30.0–36.0)
MCHC: 33.6 g/dL (ref 30.0–36.0)
MCV: 81.3 fL (ref 78.0–100.0)
MCV: 82.7 fL (ref 78.0–100.0)
Platelets: 136 K/uL — ABNORMAL LOW (ref 150–400)
RBC: 4.97 MIL/uL (ref 4.22–5.81)
RBC: 5.24 MIL/uL (ref 4.22–5.81)
RDW: 13.5 % (ref 11.5–15.5)
RDW: 13.9 % (ref 11.5–15.5)
WBC: 5.4 K/uL (ref 4.0–10.5)

## 2010-07-12 LAB — HEMOGLOBIN A1C: Hgb A1c MFr Bld: 11.9 % — ABNORMAL HIGH (ref 4.6–6.1)

## 2010-07-12 LAB — BASIC METABOLIC PANEL
BUN: 10 mg/dL (ref 6–23)
CO2: 24 mEq/L (ref 19–32)
CO2: 28 mEq/L (ref 19–32)
Calcium: 9.3 mg/dL (ref 8.4–10.5)
Creatinine, Ser: 0.75 mg/dL (ref 0.4–1.5)
Creatinine, Ser: 0.82 mg/dL (ref 0.4–1.5)
GFR calc Af Amer: >60 mL/min (ref >60)
GFR calc non Af Amer: >60 mL/min (ref >60)
Sodium: 137 mEq/L (ref 135–145)
Sodium: 137 mEq/L (ref 135–145)

## 2010-07-12 LAB — CARDIAC PANEL(CRET KIN+CKTOT+MB+TROPI)
CK, MB: 2.9 ng/mL (ref 0.3–4.0)
Total CK: 246 U/L — ABNORMAL HIGH (ref 7–232)

## 2010-07-12 LAB — DIFFERENTIAL
Basophils Absolute: 0.1 K/uL (ref 0.0–0.1)
Basophils Relative: 1 % (ref 0–1)
Eosinophils Absolute: 0.2 K/uL (ref 0.0–0.7)
Eosinophils Relative: 3 % (ref 0–5)
Lymphocytes Relative: 36 % (ref 12–46)
Lymphs Abs: 1.9 10*3/uL (ref 0.7–4.0)
Monocytes Absolute: 0.6 10*3/uL (ref 0.1–1.0)
Monocytes Relative: 10 % (ref 3–12)
Neutro Abs: 2.7 10*3/uL (ref 1.7–7.7)
Neutrophils Relative %: 50 % (ref 43–77)

## 2010-07-12 LAB — POCT CARDIAC MARKERS
CKMB, poc: 3.1 ng/mL (ref 1.0–8.0)
Myoglobin, poc: 150 ng/mL (ref 12–200)

## 2010-07-12 LAB — LIPID PANEL
Total CHOL/HDL Ratio: 4.8 RATIO
VLDL: 59 mg/dL — ABNORMAL HIGH (ref 0–40)

## 2010-07-12 LAB — TROPONIN I: Troponin I: 0.01 ng/mL (ref 0.00–0.06)

## 2010-07-12 LAB — TSH: TSH: 1.044 u[IU]/mL (ref 0.350–4.500)

## 2010-07-12 LAB — APTT: aPTT: 30 seconds (ref 24–37)

## 2010-07-12 LAB — BASIC METABOLIC PANEL WITH GFR
Calcium: 9.4 mg/dL (ref 8.4–10.5)
Chloride: 103 meq/L (ref 96–112)
GFR calc Af Amer: >60 mL/min (ref >60)
GFR calc non Af Amer: >60 mL/min (ref >60)
Glucose, Bld: 167 mg/dL — ABNORMAL HIGH (ref 70–99)
Potassium: 3.9 meq/L (ref 3.5–5.1)

## 2010-07-12 LAB — PROTIME-INR
INR: 0.97 (ref 0.00–1.49)
Prothrombin Time: 12.8 seconds (ref 11.6–15.2)

## 2010-07-12 LAB — CK TOTAL AND CKMB (NOT AT ARMC): CK, MB: 3.2 ng/mL (ref 0.3–4.0)

## 2010-09-01 NOTE — Op Note (Signed)
Shawn Wong, Shawn Wong               ACCOUNT NO.:  [id number]   MEDICAL RECORD NO.:  [id number]          PATIENT TYPE:  AMB   LOCATION:  DSC                          FACILITY:  [facility]   PHYSICIAN:  Harvie Junior, M.D.   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   POSTOPERATIVE DIAGNOSES:  1. Medial meniscal tear.  2. Lateral meniscal tear.  3. Chondromalacia of the medial, lateral, and patellofemoral      compartments with a large central osteophyte.   PRINCIPAL PROCEDURES:  1. Partial medial and lateral meniscectomy with corresponding      debridement in the medial lateral compartment.  2. Chondroplasty of the patellofemoral joint.  3. Excision of large central osteophyte with a motorized burr.   SURGEON:  Harvie Junior, MD   ASSISTANT:  Marshia Ly, PA   ANESTHESIA:  General.   BRIEF HISTORY:  Shawn Wong is a 55 year old male with a long history of  having had severe DJD of both knees, which was treated with  arthroscopically for the knee about 5 years ago.  He has done well with  that debridement, and he came in complaining of left knee pain with some  catching and locking.  X-ray showed significant degenerative changes.  We talked about the possible need for knee replacement, but he had done  well after his previous arthroscopy, and he wished to proceed with this  arthroscopic intervention.  He was brought to the operating room for  this procedure.   PROCEDURE:  The patient was brought to the operating room.  After  adequate anesthesia obtained with general anesthetic, the patient was  placed supine on the operating table.  Left leg was prepped and draped  in usual sterile fashion.  Following this, routine arthroscopic  examination of the knee, there was significant chondromalacia of  the  patellofemoral joint with grade 3 and 4 changes.  This was significantly  debrided of all loosened fragment and pieces of cartilage.  Attention  was turned to the medial compartment where the medial compartment showed  an anterior horn medial meniscal tear, which were debrided.  The  remaining rim of the meniscus appeared, as though it had been completely  resected previously.  The medial compartment had grade 3 and grade 4  changes.  This was debrided back to smooth and stable rim.  There was  one area where the cartilage was fragmenting, and we debrided this back  to smooth level of cartilage.   Once this was completed, attention was turned to the notch.  There was a  large notch osteophyte, which would not allow the knee to come into full  extension, took out a motorized burr, burred down the notch osteophyte.  It probably took about 6 or 8 mm of burr down to the level of the tibia.  Once this was completed, the suction shaver was used to remove the  remaining soft tissue  off this central spur.  Attention was then turned  to the lateral compartment where there was a lateral meniscal tear as  well as some chondromalacia of the lateral compartment.  This was  debrided back to the smooth and stable rim.  At this point, the knee was  copiously and thoroughly irrigated with 9 L of normal saline irrigation  and suction dry.  The outside portals were closed with a bandage.  Sterile compressive dressing was applied, the patient was taken to the  recovery room, and was noted to be in satisfactory condition.   ESTIMATED BLOOD LOSS:  For this procedure was none.      Harvie Junior, M.D.  Electronically Signed     JLG/MEDQ  D:  01/05/2008  T:  01/06/2008  Job:  [id number]

## 2010-09-04 NOTE — H&P (Signed)
NAMECAL, GINDLESPERGER NO.:  [id number]   MEDICAL RECORD NO.:  [id number]                   PATIENT TYPE:  INP   LOCATION:  1823                                 FACILITY:  [facility]   PHYSICIAN:  Darlin Priestly, M.D.             DATE OF BIRTH:  07/26/1955   DATE OF ADMISSION:  10/30/2002  DATE OF DISCHARGE:                                HISTORY & PHYSICAL   HISTORY OF PRESENT ILLNESS:  Shawn Wong is a 55 year old black man who is  admitted to [facility] for an acute inferior myocardial infarction.   The patient, who has no past history of cardiac disease, took a Viagra  tablet at approximately 12:30 a.m. this morning.  He then went on to have  sexual intercourse.  At approximately 1 a.m., he began to experience chest  pain.  This was described as a pressure in his left anterior chest and his  left axilla.  It was associated with dyspnea, nausea, and profound  diaphoresis.  The pain continued over the ensuing hours and ultimately  prompted him to call 911.  He was transported to the emergency department.  He arrived in the emergency department approximately three and a half hours  after the onset of chest pain.  His electrocardiogram demonstrated evidence  of an acute myocardial infarction, and hence he is admitted.  His chest  discomfort has subsided since his arrival in the emergency department.   As noted, the patient has no past history of cardiac disease, including no  history of chest pain, myocardial infarction, coronary artery disease,  congestive heart failure, or arrhythmia.  He has a history of hypertension  but has not been on medication.  There is no history of hyperlipidemia,  smoking, diabetes mellitus or family history of early coronary artery  disease.   MEDICATIONS:  He does not take any medications.   ALLERGIES:  He is reportedly allergic to PENICILLIN.   SOCIAL HISTORY:  The patient is employed as an Public relations account executive.   FAMILY HISTORY:  Noncontributory.   PAST MEDICAL HISTORY:  The patient has no medical problems other than as  described above.   REVIEW OF SYSTEMS:  Review of systems reveals no problems related to his  head, eyes, nose, mouth, throat, lungs, gastrointestinal system,  genitourinary system or extremities.  There is no history of neurologic or  psychiatric disorder.  There is no history of fever, chills, or weight loss.   PHYSICAL EXAMINATION:  VITAL SIGNS:  Blood pressure 132/84.  Pulse 90 and  regular.  Respirations 18.  GENERAL:  The patient was a middle-aged black man in no discomfort.  He is  alert, oriented, appropriate and responsive.  HEENT:  Head, eyes, nose, and mouth are unremarkable.  NECK:  The neck was without thyromegaly or adenopathy.  Carotid pulses were  palpable bilaterally and without bruits.  CARDIAC:  Examination revealed  a normal S1 and S2.  There was no S3, S4,  murmur, rub, or click.  Cardiac rhythm was regular.  No chest wall  tenderness was noted.  LUNGS:  The lungs were clear.  ABDOMEN:  The abdomen was soft and nontender.  There was no mass,  hepatosplenomegaly, bruit, distention, rebound, guarding, or rigidity.  Bowel sounds were normal.  RECTAL AND GENITAL:  Examinations were not performed as they are not  pertinent for the reason for acute care hospitalization.  EXTREMITIES:  The extremities were without edema, deviation, or deformity.  Radial and dorsalis pedal pulses were palpable bilaterally.  NEUROLOGIC:  Brief screening neurologic survey was unremarkable.   LABORATORY AND ACCESSORY CLINICAL DATA:  The electrocardiogram demonstrated  inferior ST segment elevation consistent with an acute inferior myocardial  infarction.   The chest radiograph is pending at the time of this dictation.   Blood work is pending at the time of this dictation.   IMPRESSION:  Acute inferior myocardial infarction in the setting of Viagra  use.   PLAN:   Emergent cardiac catheterization.     Shawn Wong. [name], MD                   Darlin Priestly, M.D.    MSC/MEDQ  D:  10/30/2002  T:  10/30/2002  Job:  [id number]

## 2010-09-04 NOTE — Discharge Summary (Signed)
NAMEJARIAH, Shawn NO.:  [id number]   MEDICAL RECORD NO.:  [id number]          PATIENT TYPE:  INP   LOCATION:  4731                         FACILITY:  [facility]   PHYSICIAN:  Raymon Mutton, P.A. DATE OF BIRTH:  1955/12/25   DATE OF ADMISSION:  03/16/2006  DATE OF DISCHARGE:  03/17/2006                               DISCHARGE SUMMARY   DISCHARGE DIAGNOSES:  1. Chest pain, atypical, likely musculoskeletal.  2. Known history of coronary artery disease status post coronary      artery bypass grafting (CABG) in 2004 by Dr. Dorris Fetch, with      previous history of myocardial infarction and percutaneous      intervention (PCI) to the right coronary artery (RCA).  3. Diabetes mellitus type II.  4. Hypertension.  5. Hyperlipidemia.  6. Status post bilateral anterior cruciate ligaments repair.   HISTORY OF PRESENT ILLNESS/HOSPITAL COURSE:  This is a 55 year old  African-American gentleman with previous history of coronary artery  disease, status post CABG 3 years ago, who presented to the emergency  room with complaints of chest pain around 10:30 p.m. last night.  He  said the pain occurred at around 10 o'clock in the morning and was  recurrent throughout the day, every 3 hours, lasted approximately 10 or  15 minutes, stayed in the left lateral lower chest wall without any  radiation.  He also did not have any associated symptoms such as  dyspnea, nausea, vomiting, diaphoresis.  Patient said that at times, it  was reproducible when he was trying to palpate his chest, but still he  was concerned when the intensity of pain increased and he decided to  come to [facility] for evaluation.   FAMILY HISTORY:  Positive for coronary disease.  His father died of  myocardial infarction.   SOCIAL HISTORY:  He is single, works as a Charity fundraiser.  Has a daughter, denies tobacco use, alcohol, and  illicit drugs.   ALLERGIES:   PENICILLIN.   HOSPITAL COURSE:  We admitted him to the hospital on rule out MI  protocol.  And his cardiac enzymes revealed negative result x2 by  cardiac markers, and the 3rd set of full cardiac panel with CK MB, CK,  and troponin was also negative.  Hemoglobin was 14.3, hematocrit 42.0,  white blood cells count 4.9, and platelets 165, sodium 137, potassium  3.6, BUN 10, creatinine 0.7, glucose 298.   He was pain free throughout the night, and in the morning, during the  rounds, Dr. Tresa Endo assessed him and felt comfortable to allow him to be  discharged home with outpatient followup with Dr. Jenne Campus, and  Cardiolite stress test.   DISCHARGE MEDICATIONS:  1. Benazepril 10 mg q. day.  2. Coreg 3.125 mg b.i.d.  3. Vytorin 10/80 mg q. day.  4. Aspirin 81 mg q. day.  Patient was instructed to continue his anti diabetic medications as  before.   DIET:  Low fat, low cholesterol diet.   DISCHARGE INSTRUCTIONS:  Patient was instructed to see primary care  physician to address  the issue with the diabetes because his glucose  level in the hospital was 298.   FOLLOWUP:  Stress test will be arranged in our office and it will be  exercise Myoview study, and after that, patient will be seen by Dr.  Jenne Campus.      Raymon Mutton, P.A.     MK/MEDQ  D:  03/17/2006  T:  03/17/2006  Job:  [id number]   cc:   [facility]

## 2010-09-04 NOTE — Cardiovascular Report (Signed)
Shawn Wong, Shawn Wong NO.:  [id number]   MEDICAL RECORD NO.:  [id number]                   PATIENT TYPE:  INP   LOCATION:  1823                                 FACILITY:  [facility]   PHYSICIAN:  Darlin Priestly, M.D.             DATE OF BIRTH:  10-04-1955   DATE OF PROCEDURE:  10/30/2002  DATE OF DISCHARGE:                              CARDIAC CATHETERIZATION   PROCEDURES PERFORMED:  1. Left heart catheterization.  2. Coronary angiography.  3. Left ventriculogram.  4. Temporary insertion of transvenous pacer wire.  5. Right coronary artery- distal- percutaneous transluminal coronary     angioplasty.   ATTENDING:  Darlin Priestly, M.D.   COMPLICATIONS:  None.   INDICATIONS:  Shawn Wong is a 55 year old male with no significant past  medical history who was admitted to the ER approximately 4:30 in the morning  on October 30, 2002, with three hours of substernal chest pain.  He was noted  by EKG to have acute inferior wall myocardial infarction.  He is now brought  for acute intervention.   TECHNIQUE:  After obtained informed consent, the patient was cardiac  catheterization laboratory.  The left groin was prepped and draped in the  usual sterile fashion.  ECG monitor was established.  Using the modified  Seldinger technique, a 7-French arterial sheath was inserted into the right  femoral artery and a 6-French venous sheath inserted into the right femoral  vein.  Next, a transvenous pacer wire was inserted into the RV and pacing  thresholds were determined.  A 6-French left 4 catheter was then used to  shoot the left coronary.   LEFT HEART CATHETERIZATION:  1. The LAD was large vessel coursing the apex.  There were two diagonal     branches.  The left main was a large vessel with no evidence of disease.     The LAD was noted to have a 95% proximal stenosis at the takeoff of the     first diagonal with a 50% mid-stenosis and a 70% distal  stenosis.  2. The first diagonal was medium sized vessel which bifurcated at the mid     segment and arose out of the 95% proximal LAD lesion.  The second     diagonal was a large vessel which runs in parallel fashion to the apex,     which is irregular and has no high grade stenosis.  3. Left circumflex: This was a large vessel coursing the AV groove  and gave     rise to two obtuse marginal branches.  The AV groove circumflex for any     significant disease.  The first one was a large vessel which bifurcated     distally with a 50% proximal stenosis.  The second one was a medium sized     vessel with no significant disease.  4. 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  There is a total occlusion of the distal     portion of the RCA with a visible thrombus and TIMI-2 flow.  There are     faint left and right collateral to the distal RCA.   LEFT VENTRICULOGRAPHY:  Left ventriculogram reveals preserved EF of 50% with  mild inferior hypokinesis.   HEMODYNAMICS:  Systemic arterial pressure 115/85, LV pressure 186/9. LVEDP  equals 23.   INTERVENTIONAL PROCEDURE:  RCA-distal.  Following diagnostic angiography, a  7-French JL4 guiding catheter using Seldinger technique was engaged into the  right coronary ostium.  Next, a 0.0144 J guide wire was dispensed out of the  guiding catheter and used to cross the distal RCA total occlusion position  of PLA without difficulty.  Next, a Maverick 2.5 x 20 mm balloon was  inserted  and put into the distal portion of the RCA and three subsequent  inflations to a maximum of 8 atmospheres was performed for a total of  approximately 2 minutes and 20 seconds.  Followup angiogram revealed a  restoration of TIMI-2 flow into the PDA and posterior lateral branch.  There  was noted to be a long 95% PLA lesion.  He remained hemodynamically stable.  Because of the patient's disease in the LAD, we elected  not to stent the RCA  but opted for probable bypass surgery.   Final orthogonal angiograms reveal less than 30% residual stenosis in the  distal RCA with 95% lesion in the PLA.  At this point, we elected to  conclude the procedure.  All balloons and catheters were removed.  Sheaths  were sewn in place and the patient was returned to the floor in stable  condition.   CONCLUSION:  1. Successful percutaneous transluminal coronary angioplasty to the distal     right coronary artery total occlusion.  2. Significant two vessel coronary artery disease.  3. Normal left ventricular diastolic function with wall motion abnormalities     noted above.  4. Adjunct use of Integrilin infusion.                                               Darlin Priestly, M.D.    RHM/MEDQ  D:  10/30/2002  T:  10/30/2002  Job:  [id number]

## 2010-09-04 NOTE — Consult Note (Signed)
[name], Shawn Wong NO.:  [id number]   MEDICAL RECORD NO.:  [id number]                   PATIENT TYPE:  INP   LOCATION:  2928                                 FACILITY:  [facility]   PHYSICIAN:  Salvatore Decent. Dorris Fetch, M.D.         DATE OF BIRTH:  March 15, 1956   DATE OF CONSULTATION:  DATE OF DISCHARGE:                                   CONSULTATION   REASON FOR CONSULTATION:  Three vessel coronary disease, status post MI.   CHIEF COMPLAINT:  Left arm pain.   HPI:  Shawn Wong is a 55 year old gentleman with no previous coronary  history.  He had taken Viagra at approximately midnight.  At about 1:30  a.m., he woke up in a cold sweat.  He then developed pressure in his left  upper arm near his axilla, and also subsequently developed some left  anterior chest pain.  He did have dyspnea and nausea in association with  this.  The pain persisted and he became concerned and called 911.  He was  transported to the emergency room where he was found to have evidence of an  acute MI by EKG.  He was treated and taken urgently for cardiac  catheterization.  He was found to have total occlusion of his distal right  coronary with thrombus.  Dr. Jenne Campus did an angioplasty with a good result.  He also had significant disease in his LAD as well as circumflex  distributions.  The patient currently is pain free on an Integrilin drip.  Nitroglycerin is not being used secondary to his recent Viagra usage.   PAST MEDICAL HISTORY:  He has a history of hypertension.  No other  significant medical problems.  He denies diabetes but has had multiple  elevated blood glucose levels noted during this admission.  He denies any  previous DVT, PE, peripheral vascular disease, or history of hyperlipidemia.   MEDICATIONS:  Viagra p.r.n.   ALLERGIES:  Penicillin.   FAMILY HISTORY:  Noncontributory.  He specifically denies any premature  coronary disease.   SOCIAL HISTORY:  He is  employed.  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  He is married and has a daughter.  He does not smoke.   REVIEW OF SYSTEMS:  Essentially negative.  He denies any recent fatigue or  change in energy levels.  He has noted some increased frequency of urination  and thirst.  He denies any stroke or TIA, any abnormal bleeding.  He has  lost 30 pounds but this has been intentional through diet and exercise.  He  is very physically active.  All other systems are negative.   PHYSICAL EXAMINATION:  GENERAL:  Shawn Wong is a well-appearing, 55-year-  old African American male in no acute distress.  He is well-developed, well-  nourished.  VITAL SIGNS:  He is afebrile.  His blood pressure is 115/70, pulse is 75 and  regular, respirations are 16.  His oxygen saturation is 96% on 2 liters  nasal cannula.  NEUROLOGIC:  He is alert and oriented times three and grossly intact.  He is  appropriate.  HEENT:  Within normal limits.  NECK:  Without thyromegaly, adenopathy or bruits.  CARDIAC:  Regular rate and rhythm with normal S1 and S2, no gross murmurs or  gallops.  LUNGS:  Clear to auscultation and percussion.  ABDOMEN:  Soft and nontender.  EXTREMITIES:  Without clubbing, cyanosis or edema.  He has 2+ dorsalis  pedis, posterior tibial and radial pulses bilaterally.  He has a normal  Allen's test on the left with refill within 3 seconds.   LABORATORY DATA:  Cardiac catheterization as mentioned in the HPI.  His  white count was 6.5, hematocrit 43, platelets 140.  PT 13, PTT 29.  CK on  admission was 189 with an MB of 3.3 and a troponin of 0.05.  His glucose was  362, BUN 11, creatinine 0.7.  Sodium and potassium 136 and 3.6.  EKG shows  normal sinus rhythm with Q-waves in II, III and aVF, consistent with  inferior MI.   IMPRESSION:  Shawn Wong is a very nice 55 year old, African American  gentleman who has no previous cardiac history, who presented with an acute  MI after using  Viagra.  He underwent angioplasty for an acutely occluded  right coronary with re-establishment of flow to the distal right  distribution.  He apparently is stable and pain free and is being maintained  on Integrilin drip.   Coronary artery bypass grafting is indicated for survival benefit as well as  relief of symptoms.  The indications, risks, benefits and alternatives were  discussed in detail with the patient.  He understood and accepts the risks,  and agrees to proceed.  His wife also understands the risks of the  procedure.  They understand that the risks include but are not limited to  death, stroke, MI, DVT, PE, bleeding, possible need for transfusions,  infections, other organ system dysfunction including respiratory, renal,  hepatic or GI dysfunction as well as chronic pain syndrome or wound healing  problems.   Given his markedly elevated blood glucoses, as noted on this admission, I do  not think he is a good candidate for bilateral mammary because of the risk  of sternal wound healing problems.  His Doppler examination of his left  palmar arch showed reversal with radial compression but he really has an  essentially normal Allen's test and we will evaluate that vessel  intraoperatively and hopefully can use the radial in termination of the left  mammary artery.  He also will need vein grafting for which we will use an  endoscopic approach.   Shawn Wong will be scheduled for the first available OR time which is  Thursday morning, July 15.  If he were to develop unstable symptoms again in  the interim, he would need to be done emergently.                                                Salvatore Decent Dorris Fetch, M.D.    SCH/MEDQ  D:  10/30/2002  T:  10/31/2002  Job:  [id number]   cc:   Quita Skye. [name], MD  [address]  [place], [place] [address]  Fax: [phone]

## 2010-09-04 NOTE — H&P (Signed)
NAMETIMOTHEY, DAHLSTROM NO.:  [id number]   MEDICAL RECORD NO.:  [id number]                   PATIENT TYPE:  INP   LOCATION:  4738                                 FACILITY:  [facility]   PHYSICIAN:  Salvatore Decent. Dorris Fetch, M.D.         DATE OF BIRTH:  1956-01-14   DATE OF ADMISSION:  11/12/2002  DATE OF DISCHARGE:                                HISTORY & PHYSICAL   CHIEF COMPLAINT:  Fever.   HISTORY OF PRESENT ILLNESS:  The patient is a 55 year old black male who is  status post recent coronary artery bypass grafting on November 01, 2002.  Postoperatively he did well, with the exception of intermittent fevers.  He  had some nonproductive cough, and was treated empirically with Avalox for a  possible bronchitis.  He was also treated with Solu-Medrol.  His fevers  resolved, and he was discharged home on November 08, 2002, on a 10-day course of  Avalox.  Since his discharge his temperatures have been trending upward at  home.  He noticed that on Saturday, November 10, 2002, his fever was around 99  degrees.  By Sunday, November 11, 2002, it had climbed to 102 degrees.  He spoke  with Dr. Ramon Dredge B. Tyrone Sage, who was on call for the practice over the  weekend, and was instructed to take some Tylenol and to follow up with Dr.  Viviann Spare C. Dorris Fetch in his clinic on Monday.  He was seen in the office  today on November 12, 2002, by Dr. Dorris Fetch, and was noted to have a fever of  103 degrees.  He has continued to have a nonproductive cough, but has had no  other symptoms.  Specifically he denies dysuria, chills, or sweats,  shortness of breath, headaches, nausea, vomiting, diarrhea, or any pain or  discomfort.  He was sent for blood work and a chest x-ray, and was found to  have an elevated white blood cell count at 11,000, as well as a right lower  lobe opacity on chest x-ray.  A urinalysis was also positive for many  bacteria.  He is admitted at this time for IV antibiotic therapy,  and for  further workup of his fever.   PAST MEDICAL HISTORY:  1. Coronary artery disease, status post coronary artery bypass grafting on     November 01, 2002.  2. Status post myocardial infarction.  3. Status post percutaneous coronary angiography and stenting of the right     coronary artery.  4. Hypertension.  5. Newly diagnosed type 2 non-insulin-dependent diabetes mellitus.   PAST SURGICAL HISTORY:  1. Coronary artery bypass grafting on November 01, 2002, by Dr. Dorris Fetch.  2. Left anterior cruciate ligament repair x3.  3. Right anterior cruciate ligament repair x1.   MEDICATIONS:  1. Enteric-coated aspirin 325 mg daily.  2. Coreg 6.25 mg b.i.d.  3. Imdur 30 mg daily.  4. Glucotrol XL 10 mg daily.  5. Avandia 4 mg daily.  6. Avalox 400 mg daily.  7. Lasix 40 mg daily.  8. K-Dur 20 mEq daily.   ALLERGIES:  PENICILLIN which causes rash and swelling.   SOCIAL HISTORY:  He is married.  He has one child.  He is employed as an  Airline pilot.  He denies alcohol or tobacco use.   FAMILY HISTORY:  Noncontributory.   REVIEW OF SYSTEMS:  See the history of present illness for pertinent  positives and negatives.  He has overall done well since surgery.  He has  had minimal pain.  He has been ambulating without difficulty.  He has had a  30-pound weight loss several months prior to surgery, and this was  intentional.  He denies headaches, visual changes, dysphagia, TIA symptoms,  syncope, shortness of breath, dyspnea on exertion, orthopnea, chest pain,  palpitations, abdominal pain, nausea, vomiting, diarrhea, constipation,  hematuria, hematochezia, melena, joint pain, muscle pain, weakness,  claudication symptoms, anxiety or depression, or intolerance to heat or  cold.   PHYSICAL EXAMINATION:  VITAL SIGNS:  On admission blood pressure 117/71,  heart rate 118 and regular, respirations 24, temperature 104.2 degrees.  GENERAL:  This is a very pleasant black male, who is not in  acute distress,  but appears to be feeling poorly.  HEENT:  Normocephalic, atraumatic.  Pupils equal, round, reactive to light  and accommodation.  Extraocular movements intact.  Exam of the ears  externally and the nose reveal no abnormalities.  Oropharynx is clear with  moist mucous membranes.  NECK:  Supple without lymphadenopathy, thyromegaly, or carotid bruits.  HEART:  A regular rate and rhythm, without murmurs, rubs, or gallops,  although is somewhat tachycardic.  LUNGS:  Respirations:  He is somewhat short of breath.  To auscultation his  breath sounds are decreased bilaterally.  No wheezes, rales, or rhonchi.  ABDOMEN:  Soft, nontender, nondistended, with active bowel sounds in all  quadrants.  No masses or hepatosplenomegaly.  EXTREMITIES:  No clubbing, cyanosis, or edema.  He has bilateral knee scars  as well as a healing saphenectomy site on his right lower extremity.  He  also has a healing left forearm scar from radial artery harvesting.  His  hand and arm are warm and well-perfused.  None of his incisions are red or  draining.  His distal pulses are all 2+ and symmetrical.  GENITOURINARY:  Examination is deferred.  RECTAL:  Examination is deferred.   LABORATORY DATA:  On admission the white blood cell count is 11,800,  hemoglobin 9.7, hematocrit 29, platelets 363.  A urinalysis showed many  bacteria.  A chest x-ray showed a right lower lobe opacity, possibly some atelectasis.   ASSESSMENT/PLAN:  1. This is a 55 year old black male, who is status post recent coronary     artery bypass grafting, who presents with a     fever and possible right lower lobe pneumonia.  He will be admitted for     intravenous antibiotic therapy.  We will continue to follow serial chest     x-rays.  He will also be started on incentive spirometry.  He will be     continued on his current medication regimen.        Coral Ceo, P.A.                        Salvatore Decent Dorris Fetch,  M.D.    GC/MEDQ  D:  11/13/2002  T:  11/13/2002  Job:  [id number]   cc:   [facility]

## 2010-09-04 NOTE — Discharge Summary (Signed)
NAMEMABRY, TIFT NO.:  [id number]   MEDICAL RECORD NO.:  [id number]                   PATIENT TYPE:  INP   LOCATION:  2034                                 FACILITY:  [facility]   PHYSICIAN:  Salvatore Decent. Dorris Fetch, M.D.         DATE OF BIRTH:  27-Apr-1955   DATE OF ADMISSION:  10/30/2002  DATE OF DISCHARGE:  11/08/2002                                 DISCHARGE SUMMARY   HISTORY OF PRESENT ILLNESS:  Mr. Zuercher is a 55 year old black male who was  admitted to [facility] for an acute inferior myocardial infarction.  The patient has no past history of cardiac disease.  He had an episode of  chest pain on the day of admission following the use of Viagra and sexual  intercourse.  The pain was described as a pressure in the left anterior  chest and into his left axilla.  It was associated with dyspnea, nausea and  profound diaphoresis.  It ensued and ultimately prompted a phone call to  911.  He was transported to the emergency department and electrocardiogram  demonstrated evidence of an acute  myocardial infarction and was therefore  admitted for further evaluation and treatment.   PAST MEDICAL HISTORY:  None.   MEDICATIONS ON ADMISSION:  None.   ALLERGIES:  PENICILLIN.   Social history, family history, review of symptoms and physical exam please  the history and physical done at the time of admission.   HOSPITAL COURSE:  The patient was admitted.  He ruled in for acute inferior  myocardial infarction.  He was taken to the cardiac catheterization lab by  Dr. Jenne Campus and had angioplasty of a totally occluded right coronary artery  to reperfuse the distal right circulation.  He was found to have significant  three-vessel disease and particular critical LAD disease.  Subsequently, he  was referred to [facility] C. Dorris Fetch, M.D. for surgical evaluation.  He was  felt to be a candidate for revascularization and on November 01, 2002 he  underwent the  following procedure:  Coronary artery bypass graft x 5.  The  following grafts were placed:  Left internal mammary artery to the LAD, left  radial artery to the obtuse marginal-1, saphenous vein graft to the first  diagonal, saphenous vein graft to the second diagonal, saphenous vein graft  to the posterior descending.  The patient tolerated the procedure well and  was taken to the surgical intensive care unit in stable condition.   POSTOPERATIVE HOSPITAL COURSE:  The patient's postoperative hospital course  in the intensive care unit was unremarkable.  He did have an elevated  temperature, but there was no obvious evidence of infection.  There was a  questionable thought of SIRS due to inflammation secondary to  cardiopulmonary bypass.  He was given some Solu-Medrol.  His temperatures  were observed over the next several days and he did eventually defervesce.  He was started on  empiric Avalox for possible bronchitis.  Otherwise, he was  also treated for a postoperative anemia by transfusion.  His hemoglobin and  hematocrit have stabilized.  Most recently, 8.4 and 24.8 on November 08, 2002.  He has responded well to a diuresis, but will continue as an outpatient.  His incisions are healing well without signs of infection.  He did remain in  normal sinus rhythm.  He has responded well in regards to cardiac  rehabilitation phase one modalities.  Oxygen has been weaned and he  maintains good saturations on room air.  BUN and creatinine are stable at 9  and 0.8 on November 08, 2002.  It is noted also that the patient has been found  to be a type 2 diabetic and has been started on diabetic teaching including  blood glucose monitoring.  Additionally, he has been started on both  Glucotrol XL and Avandia.   Overall, he is felt to be stable for discharge on today's date November 08, 2002.   CONDITION ON DISCHARGE:  Stable and improving.   FINAL DIAGNOSES:  1. Severe three-vessel coronary artery disease  status post inferior     myocardial infarction.  2. Status post stent right coronary artery.  3. New onset diabetes mellitus type 2.   OTHER DIAGNOSES:  1. Postoperative anemia, stable and improving.  2. Postoperative bronchitis, stable and improving.   INSTRUCTIONS:  The patient will receive written instructions in regard to  medications, activity, diet, wound care and followup.  Followup will include  Dr. Jenne Campus in two weeks, Dr. Dorris Fetch Monday, August 9 at 1 o'clock.   MEDICATIONS ON DISCHARGE:  1. Aspirin 325 mg p.o. daily.  2. Coreg  6.25 mg twice daily.  3. Imdur 30 mg daily for one month for radial artery spasm prevention.  4. Lasix 40 mg daily for one week.  5. Potassium chloride 20 mEq daily for one week.  6. Glucotrol XL 10 mg daily.  7. Avandia 4 mg daily.  8. Avalox 400 mg daily for 10 days.       Rowe Clack, P.A.-C.                    Salvatore Decent Dorris Fetch, M.D.    Sherryll Burger  D:  11/08/2002  T:  11/08/2002  Job:  [id number]   cc:   Salvatore Decent. Dorris Fetch, M.D.  [address]  [place]  [place] [address]  Fax: [phone]   [facility] office   cc:   Salvatore Decent. Dorris Fetch, M.D.  [address]  [place]  [place] [address]  Fax: [phone]   [facility] office

## 2010-09-04 NOTE — Discharge Summary (Signed)
NAMEROWAN, BLAKER NO.:  [id number]   MEDICAL RECORD NO.:  [id number]                   PATIENT TYPE:  INP   LOCATION:  4738                                 FACILITY:  [facility]   PHYSICIAN:  Salvatore Decent. Dorris Fetch, M.D.         DATE OF BIRTH:  May 21, 1955   DATE OF ADMISSION:  11/12/2002  DATE OF DISCHARGE:  11/17/2002                                 DISCHARGE SUMMARY   ADMITTING DIAGNOSES:  1. Fever.  2. Possible right lower lobe pneumonia.   PAST MEDICAL HISTORY:  1. Coronary artery disease status post CABG, November 01, 2002.  2. Status post MI.  3. Status post PCI of the right coronary artery.  4. Hypertension.  5. Recent diagnosis of diabetes mellitus type 2.  6. Both left and right anterior cruciate ligament repair.   ALLERGIES:  PENICILLIN causes rash and swelling.   BRIEF HISTORY:  Mr. Shawn Wong is a 55 year old African American man.  He was  discharged home on November 08, 2002 following his coronary bypass surgery.  While in the hospital, he did have intermittent fevers and he was treated in  the hospital as well as discharged home with Avalox for a possible  bronchitis.  Since his discharge, his oral temps have been trending upward  at home.  Saturday, July 24, his temperature was around 99 degrees by Sunday  it had climbed to 102 degrees.  He called Dr. Tyrone Sage, who was on call for  the practice, who instructed him to take Tylenol and followup with Dr.  Charlett Lango on Monday morning.  He was seen in the office on Monday,  July 26 by Dr. Dorris Fetch and at that time was noted to have a fever of 103  degrees.  At that time he also continued to have a nonproductive cough.  No  other symptoms.  Dr. Sunday Corn recommendation was to return to the  hospital for IV antibiotics and further workup to determine the cause of his  fever.   HOSPITAL COURSE:  November 12, 2002, Mr. Ohms was admitted to [facility] under the care of Dr.  Dorris Fetch.  His CBC revealed a white blood  cell count of 11,000.  His chest x-ray revealed a right lower lobe opacity.  Urinalysis was positive for many bacteria.  On admission he was started on  IV vancomycin and Rocephin.  On November 13, 2002, he continued to have a fever  with a temp of 104.2.  Urine cultures returned as negative.  Blood cultures  were also negative.  Over the next 48 hours his fevers trended downward.  On  the morning of November 14, 2002, T-max was 101.3, the 29th 100.5 and the  morning of July 30th 99.2.  Mr. Bommarito had been feeling very well despite  having a fever the last several days.  He does have a cough that is  nonproductive.  He has continued to tolerate  his diet without any difficulty  and bowel and bladder functions are within normal limits for him.  All of  his incisions continued to heal very well.  He was ambulating independently  in the hallway without any pain, without any shortness of breath.   As Mr. Xiao fevers appear to be resolving and the presumed source of  his fevers is pulmonary with that right lower lobe infiltrate, Dr.  Dorris Fetch recommends continuing IV antibiotics for a ten day course of  treatment.  Arrangements have been made for Mr. Whobrey to receive his IV  antibiotics at home.  Arrangement were made for him to be discharged  tomorrow morning, November 17, 2002 after his a.m. dose of Vancomycin.   LABORATORY STUDIES:  November 16, 2002, CBC - white blood cells 4.4, hemoglobin  8.5, hematocrit 25.2, platelets 304.  His CBGs have been acceptable running  in the region of 128-166.   CONDITION ON DISCHARGE:  Improved.   DISCHARGE INSTRUCTIONS:  Medications:  1. Rocephin 1 gram IV q.24h.  2. Vancomycin 1500 mg IV q.12h.  These both are to continue through August     4, this will complete a ten day course of IV antibiotics.  He is also instructed to resume the following home medications:  1. Aspirin 325 mg daily.  2. Coreg 6.25 mg  b.i.d.  3. Imdur 30 mg daily.  4. Glucotrol XL 10 mg daily.  5. Avandia 4 mg daily.  6. Lasix 40 mg daily.  7. Potassium chloride 20 mEq p.o. daily until his prescription is finished.   He has also been reminded to check his blood sugar daily and record.   ACTIVITY:  He is to continue refraining from driving or any heavy work.  He  is also to continue his breathing exercises and daily walking.   DIET:  Continue to be a diabetic diet.   WOUND CARE:  He should continue to shower daily.   FOLLOW UP:  He has been instructed to keep his scheduled appointments with  Dr. Dorris Fetch and Dr. Jenne Campus.      Toribio Harbour, N.P.                  Salvatore Decent Dorris Fetch, M.D.    CTK/MEDQ  D:  11/16/2002  T:  11/17/2002  Job:  [id number]   cc:   Delman Cheadle, MD  [address]  [address]  [place]  [place] [address]  Fax: [phone]

## 2010-09-04 NOTE — Op Note (Signed)
NAMEJONNY, DEARDEN NO.:  [id number]   MEDICAL RECORD NO.:  [id number]                   PATIENT TYPE:  INP   LOCATION:  2316                                 FACILITY:  [facility]   PHYSICIAN:  Salvatore Decent. Dorris Fetch, M.D.         DATE OF BIRTH:  1956-03-16   DATE OF PROCEDURE:  11/01/2002  DATE OF DISCHARGE:                                 OPERATIVE REPORT   PREOPERATIVE DIAGNOSES:  Severe three-vessel disease, status post myocardial  infarction.   POSTOPERATIVE DIAGNOSES:  Severe three-vessel disease, status post  myocardial infarction.   PROCEDURE:  Median sternotomy, extracorporeal circulation, coronary artery  bypass grafting x5 (left internal mammary artery to LAD, left radial artery  to obtuse marginal 1, saphenous vein graft to first diagonal, saphenous vein  graft to second diagonal, saphenous vein graft to posterior descending),  endoscopic vein harvest right thigh.   SURGEON:  Salvatore Decent. Dorris Fetch, M.D.   ASSISTANT:  Rowe Clack, P.A.-C.   ANESTHESIA:  General.   FINDINGS:  Good quality conduits, fair quality targets at first diagonal and  posterior descending, poor quality right posterolateral too small to graft,  inferior basilar infarct.   CLINICAL NOTE:  Mr. Petrosian is a 55 year old gentleman with no prior cardiac  history who was in his usual state of good health until the day of admission  when he presented with acute inferior MI.  He was taken emergently to the  catheterization laboratory by Dr. Lenise Herald and had angioplasty of the  totally occluded right coronary to reperfuse the distal right circulation.  He subsequently stabilized but because of residual three-vessel disease,  particularly critical LAD disease, the patient was referred for coronary  artery bypass grafting.  Indications, risks, benefits, and alternatives were  discussed in detail with the patient, and he understood and accepted the  risks of the  procedure and agreed to proceed.   OPERATIVE NOTE:  Mr. Abruzzo was brought to the preoperative holding area on  11/01/02.  Lines were placed for intravenous access and to monitor arterial  central venous and pulmonary arterial pressure.  Intravenous antibiotics  were administered.  The left Allen's test was repeated and confirmed normal  with pulse oximetry on the left index finger.  The patient then was taken to  the operating room, anesthetized, and intubated.  A Foley catheter was  placed.  The chest, abdomen, legs, and left arm were prepped and draped in  the usual fashion.   An incision was made over the medial aspect of the left wrist on the volar  aspect of the wrist and secured to the skin and subcutaneous tissue.  Hemostasis was achieved with electrocautery.  The left radial artery was  identified at the wrist.  The overlying fascia was incised, and a small  portion of the radial artery was mobilized.  A soft Bulldog clamp was placed  across the radial artery.  There was  no change in his excellent pulse  distally.  Decision was made to proceed with left radial artery harvest.  The incision then was lengthened along the forearm.  An attempt was made to  use the Harmonic scalpel, but due to technical difficulties, it was  abandoned, and the artery was harvested using the standard technique.  Branches were doubly clipped and divided.  Minimal cautery was used only  where necessary.  The patient was heparinized prior to tying the distal end  of the left radial artery.  There was excellent flow through the graft.  There was a good pulse in the ligated stump of the radial artery distally.  After ensuring that all side branches had been clipped and applying topical  papaverine, the radial artery was divided proximally.  It was then placed in  a heparinize saline/papaverine solution.  The proximal stump was suture  ligated with a 4-0 Prolene suture.  Hemostasis was achieved in the  wound.  The incision then was closed in two layers with a running 2-0 Vicryl suture  in the subcutaneous tissue and a running 3-0 Vicryl subcuticular suture.   A median sternotomy was performed, and the left internal mammary artery was  harvested using standard technique.  Simultaneously, incision was made in  the medial aspect of the right leg at the level of the knee.  The greater  saphenous vein was identified and was harvested from the right thigh  endoscopically.  An additional segment of the vein had to be harvested from  the upper calf using an open technique.  The left internal mammary artery  was an excellent quality vessel.  When it was divided, there excellent flow  through the graft.  It was approximately 2-2.5 mm in diameter.  The mammary  was placed in a papaverine-soaked sponge and placed into the left pleural  space.   The pericardium was opened.  The ascending aorta was inspected and palpated.  There was no palpable atherosclerotic disease.  Of note, there was marked  cardiomegaly.  The aorta was cannulated via concentric two Ethibond  pledgeted pursestring sutures and dual stage venous cannulas placed via the  pursestring suture in the right atrial appendage.  Cardiopulmonary bypass  was instituted, and the patient was cooled to 32 degrees Celsius.  The heart  was inspected.  There was cardiomegaly and left ventricular hypertrophy.  There was a significant inferior basilar infarct with edema and hemorrhage.  The coronary arteries were inspected, and anastomotic sites were chosen.  Of  note, the distal posterolateral branch of the right coronary was not  graftable.   A foam pad was placed in the pericardium to protect the left phrenic nerve.  A temperature probe was placed in the myocardial septum, and a cardioplegic  cannula was placed in the ascending aorta.  The conduits were inspected and  cut to length.  The aorta was cross-clamped.  The left ventricle was  entered via the aortic  root and cardiac arrest was achieved with a combination of cold antegrade  blood cardioplegia and topical iced saline.  Then 750 cc of cardioplegia was  administered.  There was a rapid diastolic arrest and good myocardial septal  cooling.  The following distal anastomoses then were performed.   First, a reverse saphenous vein graft was placed end-to-side to the  posterior descending branch to the right coronary.  This was a 1.5-mm poor  quality target.  The remaining two branches of the right were within the  infarct  zone and were not graftable.  A 1.5-mm probe did pass distally back  into the right coronary.  The vein graft was of large caliber and good  quality.  The anastomosis was performed end-to-side with a running 7-0  Prolene suture.  There was excellent flow through the graft.  Cardioplegia  was administered.  There was good hemostasis.   Next, a reverse saphenous vein graft was placed end-to-side to the first  diagonal branch of the LAD.  This vessel was a 1-mm poor quality target.  The vein again was of good quality, and the anastomosis was performed with a  running 7-0 Prolene suture.  Flow through the vein was relatively sluggish.  A 1-mm probe was passed through the vein and passed easily through the  anastomosis.  Additional cardioplegia was administered.   Next, a reverse saphenous vein graft was placed end-to-side to the second  diagonal branch of the LAD.  This was a large bifurcating anterolateral  branch.  It was 1.5 mm and fair quality.  The vein graft was of good  quality.  The anastomosis was performed with running 7-0 Prolene suture.  There was excellent flow through this graft.  Cardioplegia was administered  down all the vein grafts.   Next, the distal end of the radial artery was spatulated.  It was  anastomosed end-to-side to a large obtuse marginal branch of the left  circumflex coronary artery.  This was done just beyond the area  of the  bifurcation.  It was a 1.5-mm fair quality target.  The radial was a 2.5-mm  good quality conduit.  The anastomosis was performed end-to-side with a  running 8-0 Prolene suture.  The anastomosis was secured proximally and  distally prior to tying the suture.  Additional cardioplegia was  administered.  There was good backbleeding via the radial artery.   Next, the left internal mammary artery was brought through a window in the  pericardium.  The distal end of the mammary was spatulated.  It was  anastomosed end-to-side to the distal LAD.  The LAD was a 2-mm fair quality,  diffusely diseased vessel.  The mammary was an excellent quality graft.  The  anastomosis was performed with a running 8-0 Prolene suture.  At the  completion of the mammary to LAD anastomosis, the Bulldog clamp was removed  from the mammary artery.  Immediate and rapid septal rewarming was noted.  Lidocaine was administered.  The mammary pedicle was tacked to the epicardial surface of the heart with 6-0 Prolene sutures.   The vein grafts were cut to length.  Cardioplegia cannula was removed from  the ascending aorta.  The proximal vein grafts and radial artery proximal  anastomoses were performed to 4.0-mm punch aortotomies.  The 6-0 Prolene  sutures were used for the veins and a 7-0 Prolene for the radial.  All  anastomoses were performed while under cross-clamp.  After completion of the  final proximal anastomosis, the patient was placed in Trendelenburg  position, and the aortic root was de-aired.  The aortic cross-clamp was  removed.  The total cross-clamp time was 98 minutes.  Mr. Newsome  spontaneously resumed sinus rhythm and did not require defibrillation.   All proximal and distal anastomoses were inspected for hemostasis.  Epicardial pacing wires were placed on the right ventricle and right atrium.  When the patient was rewarmed to 37 degrees Celsius, he was weaned from  cardiopulmonary bypass  without difficulty.  Total bypass time was 151  minutes.  He was atrially paced for rate with no inotropic support at the  time of separation from bypass.  He was maintained on a low-dose  nitroglycerin drip for his radial artery graft.  The initial cardiac index  was greater than 2 L/minute per meter squared, and the patient remained  hemodynamically stable throughout the post-bypass period.   A test dose of protamine was administered and was well tolerated.  The  atrial and aortic cannulae were removed.  The remainder of the Protamine was  administered without incident.  The chest was irrigated with one liter of  warm normal saline containing 1 g of vancomycin.  Hemostasis was achieve.  A  left pleural and two mediastinal chest tubes were placed through separate  subcostal incisions.  The pericardium was reapproximated with interrupted 3-  0 silk sutures.  It came together easily without tension.  The sternum was  closed with figure-of-eight stainless steel cables.  The remainder of the  incisions were closed in standard fashion.  Subcuticular closures were used  for all skin incisions.  A Blake drain was left in the right thigh in the  tract from the endoscopic vein harvest.  All sponge, needle, and instrument  counts were correct at the end of the procedure.  The patient was brought  from the operating room to the surgical intensive care unit intubated and in  critical but stable condition.                                               Salvatore Decent Dorris Fetch, M.D.    SCH/MEDQ  D:  11/01/2002  T:  11/02/2002  Job:  [id number]   cc:   Darlin Priestly, M.D.  [phone] [address]  [place]  [place] [address]  Fax: [phone]

## 2011-01-06 LAB — URINALYSIS, ROUTINE W REFLEX MICROSCOPIC
Glucose, UA: NEGATIVE
Hgb urine dipstick: NEGATIVE
Specific Gravity, Urine: 1.016
Urobilinogen, UA: 0.2
pH: 5.5

## 2011-01-06 LAB — POCT CARDIAC MARKERS
Myoglobin, poc: 93.7
Operator id: 257131
Operator id: 294341
Troponin i, poc: <0.05

## 2011-01-06 LAB — CBC
Hemoglobin: 13.6
RBC: 5.02
WBC: 5.7

## 2011-01-06 LAB — I-STAT 8, (EC8 V) (CONVERTED LAB)
Acid-Base Excess: 1
BUN: 10
Chloride: 106
Potassium: 4
pH, Ven: 7.343 — ABNORMAL HIGH

## 2011-01-06 LAB — DIFFERENTIAL
Lymphocytes Relative: 43
Lymphs Abs: 2.5
Monocytes Absolute: 0.7
Monocytes Relative: 12
Neutro Abs: 2.4
Neutrophils Relative %: 42 — ABNORMAL LOW

## 2011-01-06 LAB — URINE MICROSCOPIC-ADD ON

## 2011-01-06 LAB — POCT I-STAT CREATININE: Creatinine, Ser: 0.8

## 2011-01-18 LAB — BASIC METABOLIC PANEL
BUN: 7
CO2: 28
Chloride: 98
Creatinine, Ser: 0.73

## 2022-03-26 ENCOUNTER — Telehealth (HOSPITAL_COMMUNITY): Payer: Self-pay | Admitting: *Deleted

## 2022-03-26 NOTE — Telephone Encounter (Signed)
VERIFIED THRU Rx PATIENT REFILL FOR HYDROXYZINE WILL BE MAILED
